# Patient Record
Sex: Female | Born: 1970 | Race: White | Hispanic: No | Marital: Married | Smoking: Former smoker
Health system: Southern US, Community
[De-identification: ages and names within clinical notes are randomized; demographics above are authoritative.]

## PROBLEM LIST (undated history)

## (undated) DIAGNOSIS — I10 Essential (primary) hypertension: Secondary | ICD-10-CM

## (undated) DIAGNOSIS — J45909 Unspecified asthma, uncomplicated: Secondary | ICD-10-CM

## (undated) DIAGNOSIS — K219 Gastro-esophageal reflux disease without esophagitis: Secondary | ICD-10-CM

## (undated) HISTORY — PX: HIP SURGERY: SHX245

## (undated) HISTORY — PX: TUBAL LIGATION: SHX77

## (undated) HISTORY — DX: Essential (primary) hypertension: I10

## (undated) HISTORY — PX: TONSILECTOMY, ADENOIDECTOMY, BILATERAL MYRINGOTOMY AND TUBES: SHX2538

## (undated) HISTORY — PX: GASTRIC BYPASS: SHX52

## (undated) HISTORY — PX: PARTIAL HYSTERECTOMY: SHX80

## (undated) HISTORY — PX: CHOLECYSTECTOMY: SHX55

---

## 2004-09-09 ENCOUNTER — Ambulatory Visit: Payer: Self-pay | Admitting: Podiatry

## 2004-10-04 ENCOUNTER — Emergency Department: Payer: Self-pay | Admitting: Emergency Medicine

## 2005-03-01 ENCOUNTER — Ambulatory Visit: Payer: Self-pay | Admitting: Specialist

## 2005-05-08 ENCOUNTER — Inpatient Hospital Stay: Payer: Self-pay

## 2007-08-12 ENCOUNTER — Emergency Department: Payer: Self-pay | Admitting: Emergency Medicine

## 2009-03-13 ENCOUNTER — Emergency Department: Payer: Self-pay | Admitting: Emergency Medicine

## 2009-07-05 ENCOUNTER — Emergency Department: Payer: Self-pay | Admitting: Emergency Medicine

## 2009-10-01 ENCOUNTER — Emergency Department: Payer: Self-pay | Admitting: Emergency Medicine

## 2011-01-17 ENCOUNTER — Emergency Department: Payer: Self-pay | Admitting: Emergency Medicine

## 2011-08-04 ENCOUNTER — Inpatient Hospital Stay: Payer: Self-pay | Admitting: Internal Medicine

## 2011-08-12 ENCOUNTER — Ambulatory Visit: Payer: Self-pay | Admitting: Gastroenterology

## 2012-05-21 ENCOUNTER — Ambulatory Visit: Payer: Self-pay

## 2013-06-25 ENCOUNTER — Ambulatory Visit: Payer: Self-pay | Admitting: Family Medicine

## 2015-03-19 ENCOUNTER — Other Ambulatory Visit: Payer: Self-pay | Admitting: Family Medicine

## 2015-03-19 DIAGNOSIS — Z1239 Encounter for other screening for malignant neoplasm of breast: Secondary | ICD-10-CM

## 2015-05-17 ENCOUNTER — Ambulatory Visit
Admission: RE | Admit: 2015-05-17 | Discharge: 2015-05-17 | Disposition: A | Discharge status: Home / Self Care | Payer: BC Managed Care – PPO | Source: Ambulatory Visit | Attending: Family Medicine | Admitting: Family Medicine

## 2015-05-17 DIAGNOSIS — Z1231 Encounter for screening mammogram for malignant neoplasm of breast: Secondary | ICD-10-CM | POA: Diagnosis present

## 2015-05-17 DIAGNOSIS — R928 Other abnormal and inconclusive findings on diagnostic imaging of breast: Secondary | ICD-10-CM | POA: Diagnosis not present

## 2015-05-17 DIAGNOSIS — Z1239 Encounter for other screening for malignant neoplasm of breast: Secondary | ICD-10-CM

## 2015-05-18 ENCOUNTER — Other Ambulatory Visit: Payer: Self-pay | Admitting: Family Medicine

## 2015-05-18 DIAGNOSIS — R928 Other abnormal and inconclusive findings on diagnostic imaging of breast: Secondary | ICD-10-CM

## 2015-05-18 DIAGNOSIS — N6489 Other specified disorders of breast: Secondary | ICD-10-CM

## 2015-05-19 ENCOUNTER — Ambulatory Visit: Payer: BC Managed Care – PPO

## 2015-05-19 ENCOUNTER — Ambulatory Visit
Admission: RE | Admit: 2015-05-19 | Discharge: 2015-05-19 | Disposition: A | Discharge status: Home / Self Care | Payer: BC Managed Care – PPO | Source: Ambulatory Visit | Attending: Family Medicine | Admitting: Family Medicine

## 2015-05-19 DIAGNOSIS — R928 Other abnormal and inconclusive findings on diagnostic imaging of breast: Secondary | ICD-10-CM | POA: Diagnosis present

## 2015-05-19 DIAGNOSIS — N6489 Other specified disorders of breast: Secondary | ICD-10-CM | POA: Insufficient documentation

## 2015-05-20 ENCOUNTER — Ambulatory Visit: Payer: BC Managed Care – PPO

## 2015-05-20 ENCOUNTER — Other Ambulatory Visit: Payer: BC Managed Care – PPO

## 2016-07-20 IMAGING — MG MM DIAG BREAST TOMO UNI RIGHT
6 series · 6 of 14 positions shown · non-contrast
Comparison: Previous exam(s).

CLINICAL DATA: Further evaluation of possible upper-outer quadrant
right breast asymmetry

EXAM:
DIGITAL DIAGNOSTIC RIGHT MAMMOGRAM WITH 3D TOMOSYNTHESIS AND CAD

[R CC]
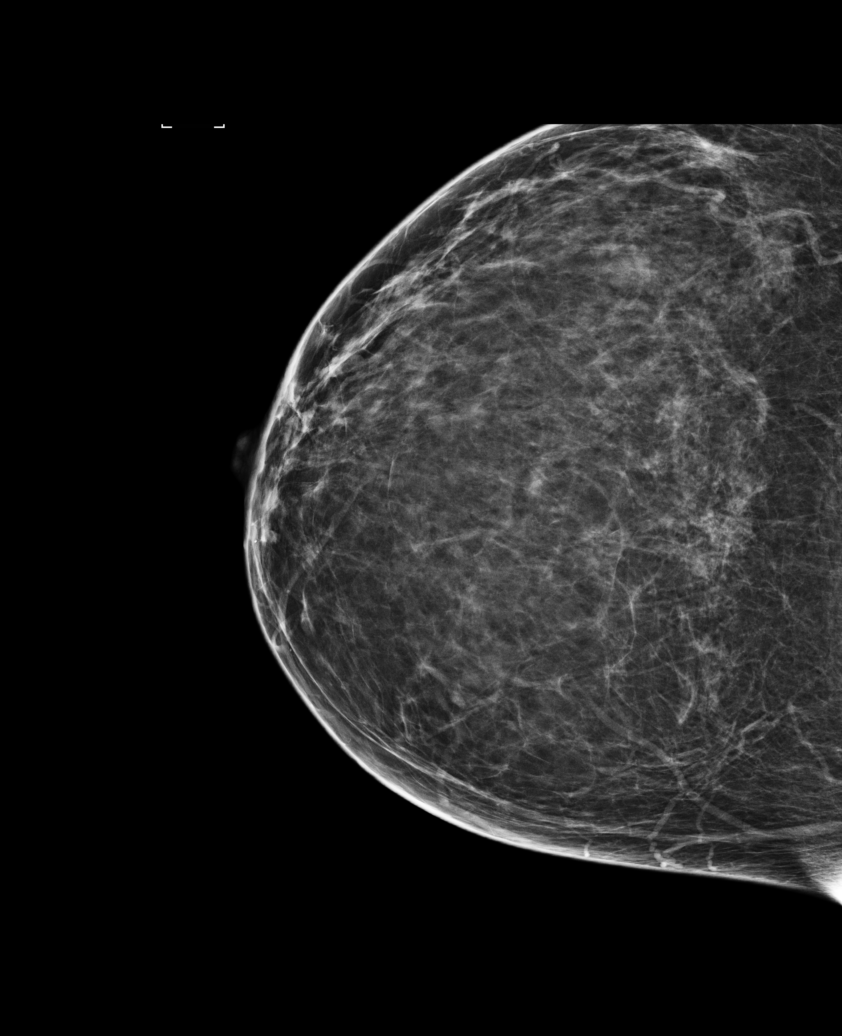

[R MLO synth-2D]
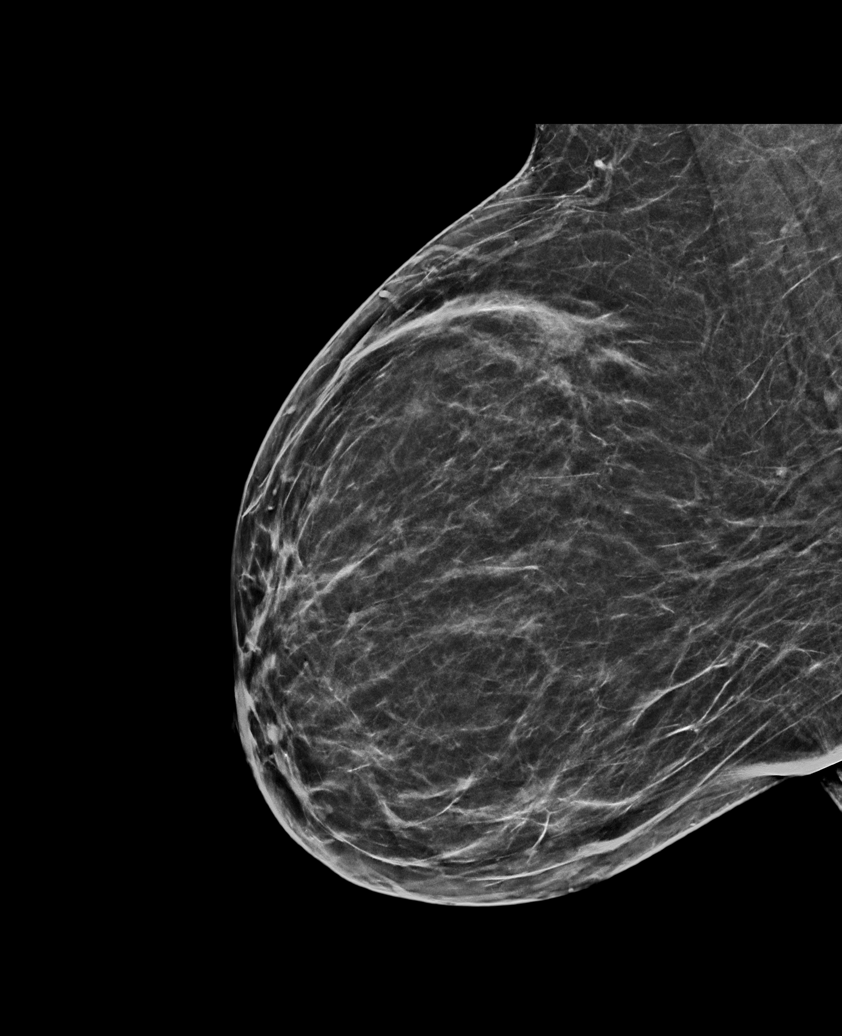

[R MLO]
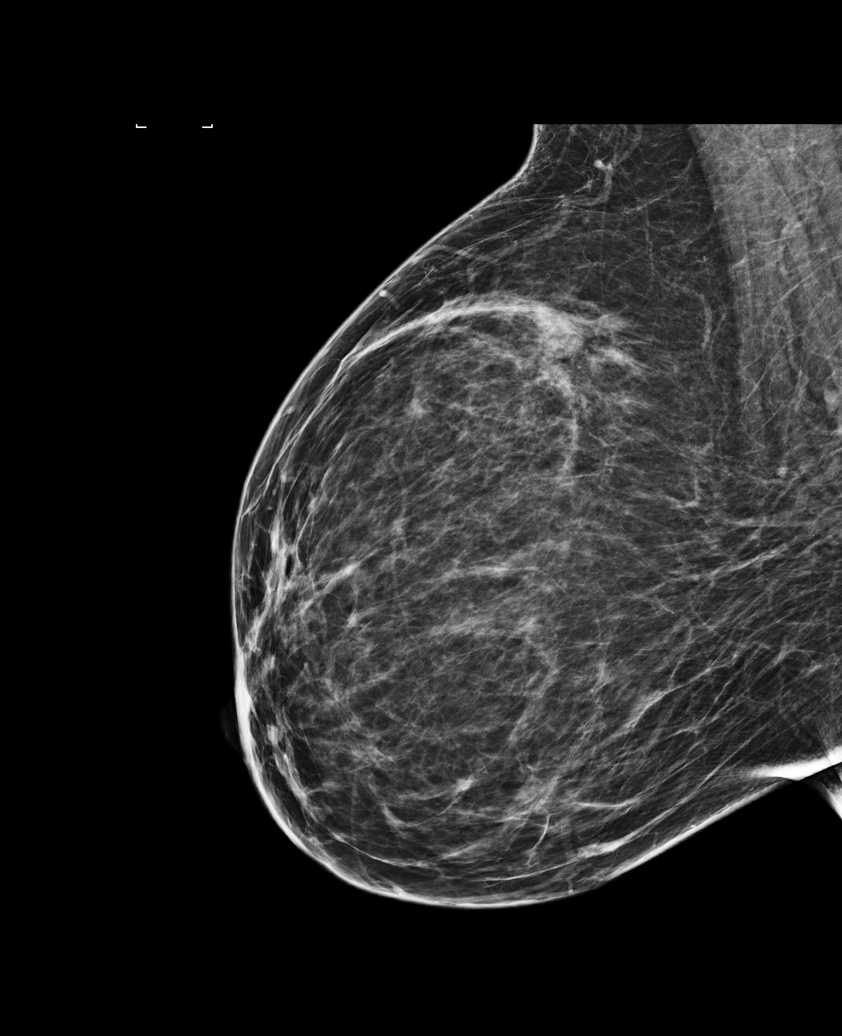

[R CC synth-2D]
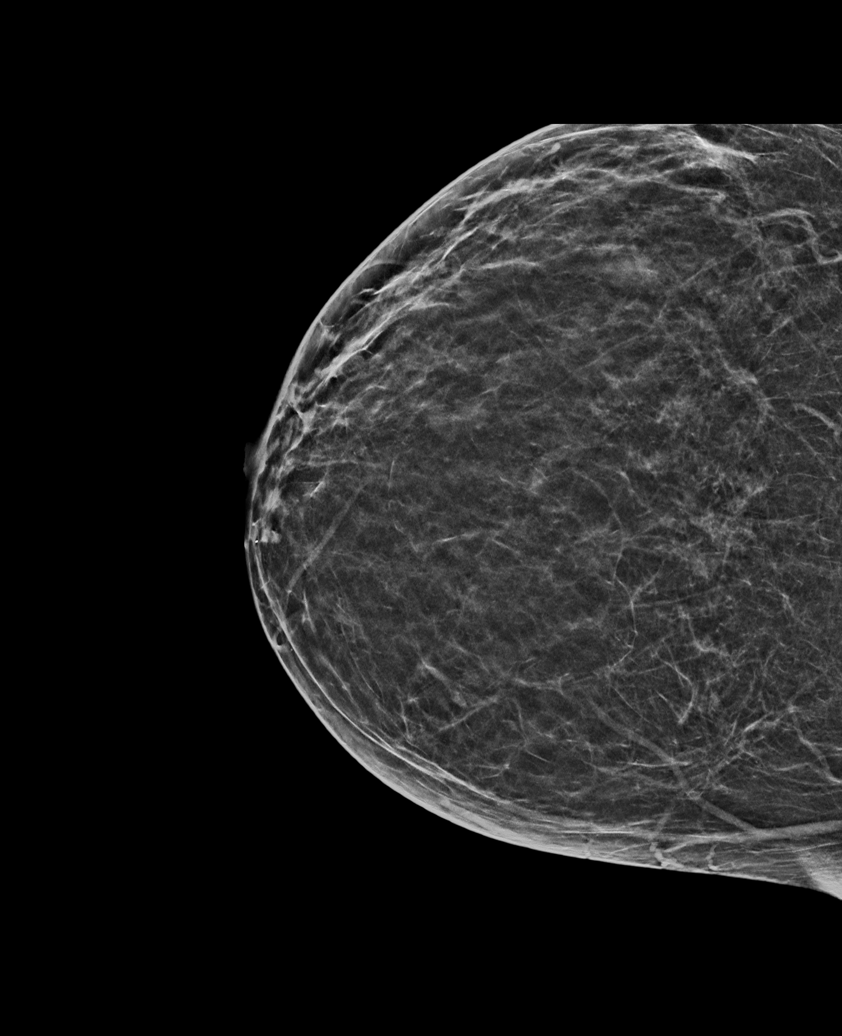

[R MLO tomo · tomo slice 31/60.0]
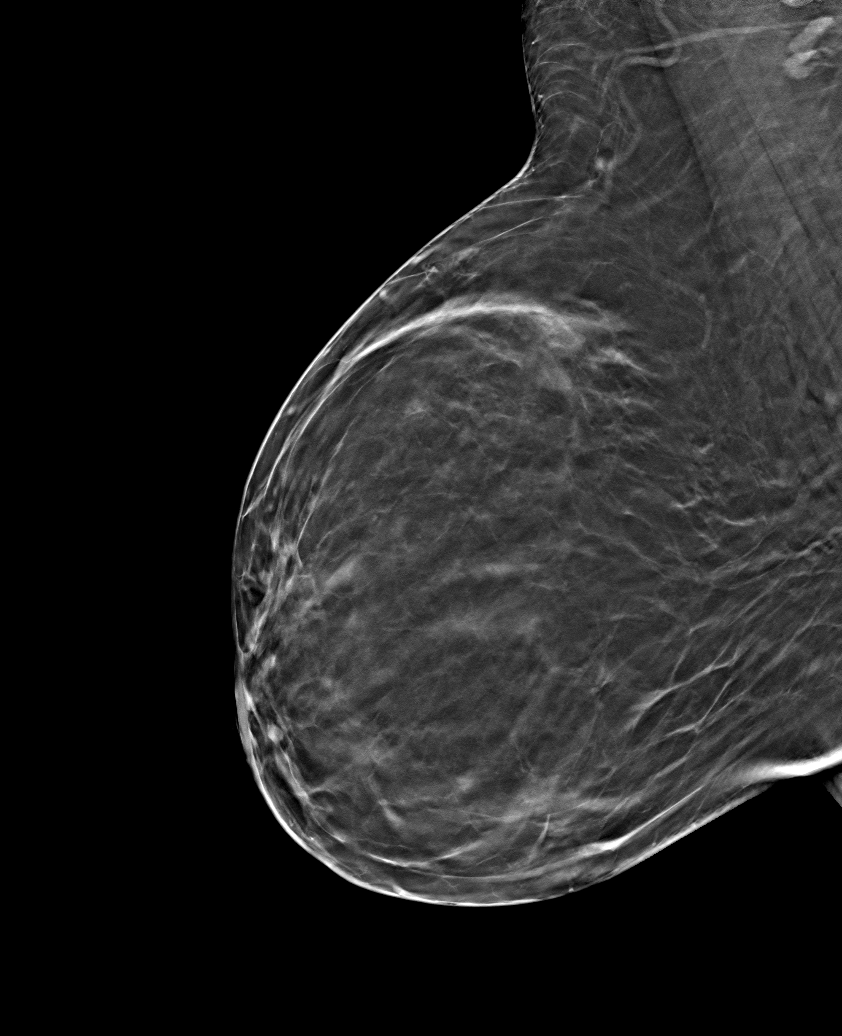

[R CC tomo · tomo slice 25/50.0]
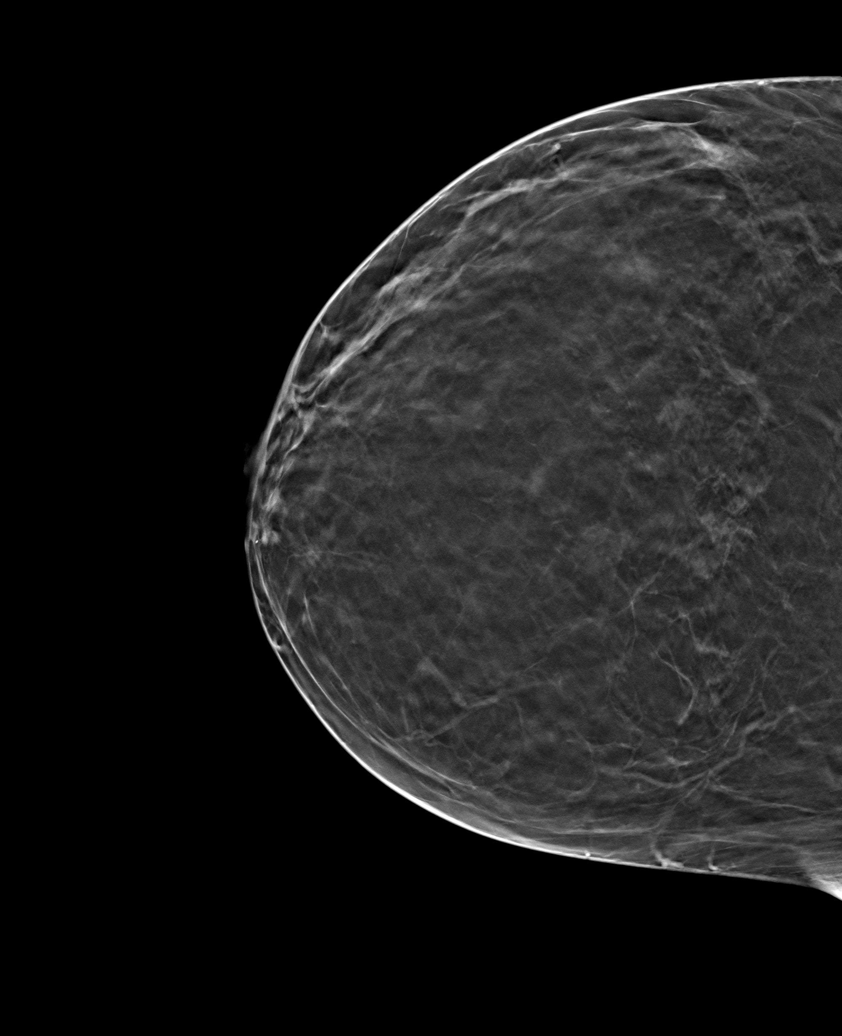

[6 of 14 positions shown; findings below may reference images not displayed]

ACR Breast Density Category b: There are scattered areas of
fibroglandular density.
FINDINGS: There are no persistent abnormalities on diagnostic tomographic
images.

Mammographic images were processed with CAD.
IMPRESSION: Normal overlapping tissue

RECOMMENDATION:
Return to annual screening mammography

I have discussed the findings and recommendations with the patient.
Results were also provided in writing at the conclusion of the
visit. If applicable, a reminder letter will be sent to the patient
regarding the next appointment.

BI-RADS CATEGORY  1: Negative.

## 2017-04-27 ENCOUNTER — Ambulatory Visit: Payer: BC Managed Care – PPO | Attending: Neurology

## 2017-04-27 DIAGNOSIS — F5101 Primary insomnia: Secondary | ICD-10-CM | POA: Insufficient documentation

## 2017-04-27 DIAGNOSIS — R0683 Snoring: Secondary | ICD-10-CM | POA: Insufficient documentation

## 2018-08-06 ENCOUNTER — Other Ambulatory Visit: Payer: Self-pay | Admitting: Family

## 2018-08-06 DIAGNOSIS — R1032 Left lower quadrant pain: Secondary | ICD-10-CM

## 2018-08-31 ENCOUNTER — Other Ambulatory Visit
Admission: RE | Admit: 2018-08-31 | Discharge: 2018-08-31 | Disposition: A | Discharge status: Home / Self Care | Payer: BC Managed Care – PPO | Source: Ambulatory Visit | Attending: Student | Admitting: Student

## 2018-08-31 DIAGNOSIS — K529 Noninfective gastroenteritis and colitis, unspecified: Secondary | ICD-10-CM | POA: Insufficient documentation

## 2018-08-31 DIAGNOSIS — R1084 Generalized abdominal pain: Secondary | ICD-10-CM | POA: Insufficient documentation

## 2018-08-31 LAB — C DIFFICILE QUICK SCREEN W PCR REFLEX
C Diff antigen: NEGATIVE
C Diff interpretation: NOT DETECTED
C Diff toxin: NEGATIVE

## 2018-08-31 LAB — GASTROINTESTINAL PANEL BY PCR, STOOL (REPLACES STOOL CULTURE)

## 2018-09-05 LAB — CALPROTECTIN, FECAL: Calprotectin, Fecal: 98 ug/g (ref 0–120)

## 2018-09-09 LAB — PANCREATIC ELASTASE, FECAL: Pancreatic Elastase-1, Stool: >500 ug Elast./g (ref >200)

## 2019-07-31 ENCOUNTER — Other Ambulatory Visit: Payer: Self-pay | Admitting: Family Medicine

## 2019-07-31 DIAGNOSIS — Z1231 Encounter for screening mammogram for malignant neoplasm of breast: Secondary | ICD-10-CM

## 2019-09-02 ENCOUNTER — Other Ambulatory Visit: Payer: Self-pay | Admitting: Student

## 2019-09-02 DIAGNOSIS — R1010 Upper abdominal pain, unspecified: Secondary | ICD-10-CM

## 2019-09-02 DIAGNOSIS — R198 Other specified symptoms and signs involving the digestive system and abdomen: Secondary | ICD-10-CM

## 2019-09-22 ENCOUNTER — Encounter
Admission: RE | Admit: 2019-09-22 | Discharge: 2019-09-22 | Disposition: A | Discharge status: Home / Self Care | Payer: BC Managed Care – PPO | Source: Ambulatory Visit | Attending: Student | Admitting: Student

## 2019-09-22 ENCOUNTER — Other Ambulatory Visit: Payer: Self-pay

## 2019-09-22 DIAGNOSIS — R198 Other specified symptoms and signs involving the digestive system and abdomen: Secondary | ICD-10-CM | POA: Diagnosis present

## 2019-09-22 DIAGNOSIS — R1010 Upper abdominal pain, unspecified: Secondary | ICD-10-CM | POA: Insufficient documentation

## 2019-09-22 MED ORDER — TECHNETIUM TC 99M SULFUR COLLOID
2.0000 | Freq: Once | INTRAVENOUS | Status: AC | PRN
  Administered 2019-09-22: 2.39 via INTRAVENOUS

## 2019-11-06 ENCOUNTER — Ambulatory Visit
Admission: RE | Admit: 2019-11-06 | Discharge: 2019-11-06 | Disposition: A | Discharge status: Home / Self Care | Payer: BC Managed Care – PPO | Source: Ambulatory Visit | Attending: Family Medicine | Admitting: Family Medicine

## 2019-11-06 DIAGNOSIS — Z1231 Encounter for screening mammogram for malignant neoplasm of breast: Secondary | ICD-10-CM | POA: Diagnosis present

## 2020-06-23 ENCOUNTER — Other Ambulatory Visit: Payer: Self-pay | Admitting: Physician Assistant

## 2020-06-23 DIAGNOSIS — M545 Low back pain, unspecified: Secondary | ICD-10-CM

## 2020-06-29 ENCOUNTER — Other Ambulatory Visit: Payer: Self-pay | Admitting: Physician Assistant

## 2020-06-29 DIAGNOSIS — R319 Hematuria, unspecified: Secondary | ICD-10-CM

## 2020-06-29 DIAGNOSIS — M545 Low back pain, unspecified: Secondary | ICD-10-CM

## 2020-11-23 ENCOUNTER — Other Ambulatory Visit: Payer: Self-pay | Admitting: Family Medicine

## 2020-11-23 DIAGNOSIS — Z1231 Encounter for screening mammogram for malignant neoplasm of breast: Secondary | ICD-10-CM

## 2020-12-20 ENCOUNTER — Ambulatory Visit
Admission: RE | Admit: 2020-12-20 | Discharge: 2020-12-20 | Disposition: A | Discharge status: Home / Self Care | Payer: BC Managed Care – PPO | Source: Ambulatory Visit | Attending: Family Medicine | Admitting: Family Medicine

## 2020-12-20 ENCOUNTER — Other Ambulatory Visit: Payer: Self-pay

## 2020-12-20 DIAGNOSIS — Z1231 Encounter for screening mammogram for malignant neoplasm of breast: Secondary | ICD-10-CM | POA: Diagnosis not present

## 2021-05-26 ENCOUNTER — Institutional Professional Consult (permissible substitution): Payer: BC Managed Care – PPO | Admitting: Internal Medicine

## 2021-06-08 ENCOUNTER — Other Ambulatory Visit: Payer: Self-pay

## 2021-06-08 ENCOUNTER — Ambulatory Visit: Payer: BC Managed Care – PPO | Admitting: Pulmonary Disease

## 2021-06-08 ENCOUNTER — Other Ambulatory Visit
Admission: RE | Admit: 2021-06-08 | Discharge: 2021-06-08 | Disposition: A | Discharge status: Home / Self Care | Payer: BC Managed Care – PPO | Source: Ambulatory Visit | Attending: Pulmonary Disease | Admitting: Pulmonary Disease

## 2021-06-08 ENCOUNTER — Encounter: Payer: Self-pay | Admitting: Pulmonary Disease

## 2021-06-08 VITALS — BP 140/100 | HR 80 | Temp 98.2°F | Ht 65.0 in | Wt 222.8 lb

## 2021-06-08 DIAGNOSIS — Z01812 Encounter for preprocedural laboratory examination: Secondary | ICD-10-CM | POA: Insufficient documentation

## 2021-06-08 DIAGNOSIS — R053 Chronic cough: Secondary | ICD-10-CM

## 2021-06-08 DIAGNOSIS — K219 Gastro-esophageal reflux disease without esophagitis: Secondary | ICD-10-CM

## 2021-06-08 DIAGNOSIS — Z20822 Contact with and (suspected) exposure to covid-19: Secondary | ICD-10-CM | POA: Diagnosis not present

## 2021-06-08 LAB — SARS CORONAVIRUS 2 (TAT 6-24 HRS): SARS Coronavirus 2: NEGATIVE

## 2021-06-08 NOTE — Patient Instructions (Signed)
I am going to recommend that your primary care physician change your blood pressure medication.  It seems like the ENT physician did see evidence of what we call laryngopharyngeal reflux on your exam.  This comes from significant reflux.  Please continue taking your antireflux medications.  If your symptoms continue I would recommend that you get referred to a gastroenterologist to see if anything else needs to be done.  I suspect that this is adding to your cough issues.  We are going to get breathing test to evaluate your your lung function.  I would like to see you back in 6 to 8 weeks time at that time we can then assess if you are having issues with cough what else needs to be done.  You should have your breathing test before then and that would give [place] an idea.

## 2021-06-08 NOTE — Progress Notes (Signed)
    Assessment & Plan:  1. Chronic cough (Primary) - Pulmonary Function Test [facility] Only; Future  2. Laryngopharyngeal reflux (LPR) - Pulmonary Function Test [facility] Only; Future   Patient Instructions  I am going to recommend that your primary care physician change your blood pressure medication.  It seems like the ENT physician did see evidence of what we call laryngopharyngeal reflux on your exam.  This comes from significant reflux.  Please continue taking your antireflux medications.  If your symptoms continue I would recommend that you get referred to a gastroenterologist to see if anything else needs to be done.  I suspect that this is adding to your cough issues.  We are going to get breathing test to evaluate your your lung function.  I would like to see you back in 6 to 8 weeks time at that time we can then assess if you are having issues with cough what else needs to be done.  You should have your breathing test before then and that would give us  an idea.  Please note: late entry documentation due to logistical difficulties during COVID-19 pandemic. This note is filed for information purposes only, and is not intended to be used for billing, nor does it represent the full scope/nature of the visit in question. Please see any associated scanned media linked to date of encounter for additional pertinent information.  Subjective:    HPI: Dawn Douglas is a 50 y.o. female presenting to the pulmonology clinic on 06/08/2021 with report of: Consult (Patient has a chronic cough for about 5-6 years but has got worse over the years, went for allergy testing and it was fine. Cough is mostly dry but sometimes productive with clear sputum. Exertion makes it worse. )     Outpatient Encounter Medications as of 06/08/2021  Medication Sig   cyanocobalamin (,VITAMIN B-12,) 1000 MCG/ML injection Inject 1,000 mcg into the muscle every 14 (fourteen) days.   fluticasone  (FLONASE) 50 MCG/ACT  nasal spray Place 2 sprays into both nostrils daily.   furosemide (LASIX) 40 MG tablet Take 40 mg by mouth daily.   rosuvastatin (CRESTOR) 20 MG tablet Take 40 mg by mouth at bedtime.   spironolactone (ALDACTONE) 50 MG tablet Take 50 mg by mouth daily.   venlafaxine XR (EFFEXOR-XR) 150 MG 24 hr capsule Take 150 mg by mouth daily.   zolpidem (AMBIEN CR) 6.25 MG CR tablet Take 6.25 mg by mouth at bedtime.   [DISCONTINUED] amLODipine -benazepril (LOTREL) 5-40 MG capsule Take 1 capsule by mouth daily. (Patient not taking: Reported on 10/26/2022)   [DISCONTINUED] pantoprazole (PROTONIX) 40 MG tablet Take 40 mg by mouth daily.   No facility-administered encounter medications on file as of 06/08/2021.      Objective:   Vitals:   06/08/21 0851  BP: (!) 140/100 Comment: has not been taking BP meds  Pulse: 80  Temp: 98.2 F (36.8 C)  Height: 5' 5 (1.651 m)  Weight: 222 lb 12.8 oz (101.1 kg)  SpO2: 97%  TempSrc: Oral  BMI (Calculated): 37.08     Physical exam documentation is limited by delayed entry of information.

## 2021-06-09 ENCOUNTER — Ambulatory Visit: Payer: BC Managed Care – PPO | Attending: Pulmonary Disease

## 2021-06-09 ENCOUNTER — Telehealth: Payer: Self-pay

## 2021-06-09 DIAGNOSIS — R053 Chronic cough: Secondary | ICD-10-CM | POA: Diagnosis not present

## 2021-06-09 DIAGNOSIS — K219 Gastro-esophageal reflux disease without esophagitis: Secondary | ICD-10-CM | POA: Insufficient documentation

## 2021-06-09 MED ORDER — ALBUTEROL SULFATE (2.5 MG/3ML) 0.083% IN NEBU
2.5000 mg | INHALATION_SOLUTION | Freq: Once | RESPIRATORY_TRACT | Status: AC
  Administered 2021-06-09: 2.5 mg via RESPIRATORY_TRACT
  Filled 2021-06-09: qty 3

## 2021-06-09 NOTE — Telephone Encounter (Signed)
Called and spoke with patient, pt aware, nothing further needed.

## 2021-07-13 ENCOUNTER — Telehealth: Payer: Self-pay | Admitting: Primary Care

## 2021-07-13 MED ORDER — FLUTICASONE FUROATE-VILANTEROL 100-25 MCG/INH IN AEPB: 1.0000 | INHALATION_SPRAY | Freq: Every day | RESPIRATORY_TRACT | 11 refills | Status: AC

## 2021-07-13 NOTE — Telephone Encounter (Signed)
Left detailed message for patient requesting a call back to discuss results.

## 2021-07-13 NOTE — Telephone Encounter (Signed)
We do not currently have samples of Breo. Rx has been sent to preferred pharmacy. Patient is aware and voiced her understanding.  Nothing further needed.

## 2021-07-13 NOTE — Telephone Encounter (Signed)
Spoke with patient to go over message from Dr. Patsey Berthold and recs. Patient expressed understanding. Told her I would call [place] office to see if they had samples for her to pick up and if they didn't we would call her back to let her know.

## 2021-07-13 NOTE — Telephone Encounter (Signed)
Tyler Pita, MD  Claudette Head A, CMA Her breathing test show that she may have cough variant asthma.  I still would want her primary care physician to change her blood pressure medication and she is on an ACE inhibitor.  We will give her a trial of Breo Ellipta 100/25, 1 inhalation daily or what ever similar medication her insurance covers.  She is to make sure she rinses her mouth well after use of Breo.  The breathing test also shows that she would benefit from weight loss as it would help her take deeper breaths.   Lm for patient.

## 2021-07-13 NOTE — Telephone Encounter (Signed)
Pt returning missed call. 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

## 2021-07-21 ENCOUNTER — Telehealth: Payer: Self-pay | Admitting: Pulmonary Disease

## 2021-07-21 NOTE — Telephone Encounter (Signed)
Dr. Patsey Berthold, please see below message and advise. thanks

## 2021-07-22 NOTE — Telephone Encounter (Signed)
Can postpone 2-3 mos.

## 2021-07-22 NOTE — Telephone Encounter (Signed)
Lm for patient.  

## 2021-07-25 ENCOUNTER — Ambulatory Visit: Payer: BC Managed Care – PPO | Admitting: Primary Care

## 2021-07-25 NOTE — Telephone Encounter (Signed)
Spoke to patient and relayed below message.  Patient stated that she will call back later this week to schedule, as she does not currently have her calendar.

## 2021-11-14 ENCOUNTER — Telehealth: Payer: Self-pay | Admitting: Pulmonary Disease

## 2021-11-14 ENCOUNTER — Ambulatory Visit: Payer: BC Managed Care – PPO | Admitting: Pulmonary Disease

## 2021-11-14 NOTE — Telephone Encounter (Signed)
Dr Patsey Berthold please advise:  Pt states new inhaler is working very well. no coughing, is asking if appointment could be a 69mo rov instead of 3.

## 2021-11-14 NOTE — Telephone Encounter (Signed)
Created in error

## 2021-11-14 NOTE — Telephone Encounter (Signed)
Called and LVM for patient in regards to rescheduling OFV. Per Dr Patsey Berthold 6 Month f/u is okay.

## 2021-11-14 NOTE — Telephone Encounter (Signed)
Okay for 6 months follow-up.

## 2021-11-22 NOTE — Telephone Encounter (Signed)
Called and spoke to patient in regards to 6 month rov. Nothing further needed. Pt will be seen 12/13/21

## 2021-12-13 ENCOUNTER — Ambulatory Visit: Payer: BC Managed Care – PPO | Admitting: Pulmonary Disease

## 2022-02-07 ENCOUNTER — Other Ambulatory Visit: Payer: Self-pay | Admitting: Family Medicine

## 2022-02-07 DIAGNOSIS — Z1231 Encounter for screening mammogram for malignant neoplasm of breast: Secondary | ICD-10-CM

## 2022-03-02 ENCOUNTER — Ambulatory Visit
Admission: RE | Admit: 2022-03-02 | Discharge: 2022-03-02 | Disposition: A | Discharge status: Home / Self Care | Payer: BC Managed Care – PPO | Source: Ambulatory Visit | Attending: Family Medicine | Admitting: Family Medicine

## 2022-03-02 DIAGNOSIS — Z1231 Encounter for screening mammogram for malignant neoplasm of breast: Secondary | ICD-10-CM | POA: Diagnosis not present

## 2022-03-30 ENCOUNTER — Encounter: Payer: Self-pay | Admitting: Pulmonary Disease

## 2022-03-30 ENCOUNTER — Ambulatory Visit: Payer: BC Managed Care – PPO | Admitting: Pulmonary Disease

## 2022-03-30 VITALS — BP 126/76 | HR 82 | Temp 97.8°F | Ht 65.0 in | Wt 215.8 lb

## 2022-03-30 DIAGNOSIS — K219 Gastro-esophageal reflux disease without esophagitis: Secondary | ICD-10-CM | POA: Diagnosis not present

## 2022-03-30 DIAGNOSIS — R053 Chronic cough: Secondary | ICD-10-CM | POA: Diagnosis not present

## 2022-03-30 DIAGNOSIS — J45991 Cough variant asthma: Secondary | ICD-10-CM | POA: Diagnosis not present

## 2022-03-30 DIAGNOSIS — E669 Obesity, unspecified: Secondary | ICD-10-CM

## 2022-03-30 MED ORDER — ALBUTEROL SULFATE HFA 108 (90 BASE) MCG/ACT IN AERS: 2.0000 | INHALATION_SPRAY | Freq: Four times a day (QID) | RESPIRATORY_TRACT | 2 refills | Status: DC | PRN

## 2022-03-30 NOTE — Patient Instructions (Signed)
We sent in a prescription for albuterol as rescue medication to your pharmacy.  Let us see you back in follow-up in 6 months time.  Call sooner should any new issues arise or if your cough recurs.

## 2022-03-30 NOTE — Progress Notes (Incomplete)
   Subjective:    Patient ID: Dawn Douglas, female    DOB: 19-Sep-1971, 51 y.o.   MRN: [id number]  Patient Care Team: Leonides Sake, MD as PCP - General (Family Medicine)  Chief Complaint  Patient presents with  . Follow-up    Review PFT--occ dry cough.    HPI    Review of Systems A 10 point review of systems was performed and it is as noted above otherwise negative.  Patient Active Problem List   Diagnosis Date Noted  . Cough variant asthma 03/30/2022  . Chronic cough 06/08/2021        Objective:   Physical Exam BP 126/76 (BP Location: Left Arm, Cuff Size: Normal)   Pulse 82   Temp 97.8 F (36.6 C) (Temporal)   Ht '5\' 5"'$  (1.651 m)   Wt 215 lb 12.8 oz (97.9 kg)   SpO2 95%   BMI 35.91 kg/m  GENERAL: HEAD: Normocephalic, atraumatic.  EYES: Pupils equal, round, reactive to light.  No scleral icterus.  MOUTH:  NECK: Supple. No thyromegaly. Trachea midline. No JVD.  No adenopathy. PULMONARY: Good air entry bilaterally.  No adventitious sounds. CARDIOVASCULAR: S1 and S2. Regular rate and rhythm.  ABDOMEN: MUSCULOSKELETAL: No joint deformity, no clubbing, no edema.  NEUROLOGIC:  SKIN: Intact,warm,dry. PSYCH:       Assessment & Plan:

## 2022-04-25 ENCOUNTER — Encounter: Payer: Self-pay | Admitting: Pulmonary Disease

## 2022-10-02 ENCOUNTER — Ambulatory Visit: Payer: Self-pay | Admitting: Surgery

## 2022-10-04 ENCOUNTER — Other Ambulatory Visit: Payer: Self-pay

## 2022-10-04 ENCOUNTER — Ambulatory Visit (INDEPENDENT_AMBULATORY_CARE_PROVIDER_SITE_OTHER): Payer: BC Managed Care – PPO | Admitting: Surgery

## 2022-10-04 ENCOUNTER — Encounter: Payer: Self-pay | Admitting: Surgery

## 2022-10-04 VITALS — BP 121/85 | HR 88 | Temp 98.7°F | Ht 65.0 in | Wt 219.0 lb

## 2022-10-04 DIAGNOSIS — D170 Benign lipomatous neoplasm of skin and subcutaneous tissue of head, face and neck: Secondary | ICD-10-CM

## 2022-10-04 NOTE — Patient Instructions (Addendum)
Please call and let us know when you decide to move forward with having surgery.    Our surgery scheduler will call you within 24-48 hours to schedule your surgery. Please have the [facility] surgery sheet available when speaking with her.     Lipoma  A lipoma is a noncancerous (benign) tumor that is made up of fat cells. This is a very common type of soft-tissue growth. Lipomas are usually found under the skin (subcutaneous). They may occur in any tissue of the body that contains fat. Common areas for lipomas to appear include the back, arms, shoulders, buttocks, and thighs. Lipomas grow slowly, and they are usually painless. Most lipomas do not cause problems and do not require treatment. What are the causes? The cause of this condition is not known. What increases the risk? You are more likely to develop this condition if: You are 55-64 years old. You have a family history of lipomas. What are the signs or symptoms? A lipoma usually appears as a small, round bump under the skin. In most cases, the lump will: Feel soft or rubbery. Not cause pain or other symptoms. However, if a lipoma is located in an area where it pushes on nerves, it can become painful or cause other symptoms. How is this diagnosed? A lipoma can usually be diagnosed with a physical exam. You may also have tests to confirm the diagnosis and to rule out other conditions. Tests may include: Imaging tests, such as a CT scan or an MRI. Removal of a tissue sample to be looked at under a microscope (biopsy). How is this treated? Treatment for this condition depends on the size of the lipoma and whether it is causing any symptoms. For small lipomas that are not causing problems, no treatment is needed. If a lipoma is bigger or it causes problems, surgery may be done to remove the lipoma. Lipomas can also be removed to improve appearance. Most often, the procedure is done after applying a medicine that numbs the area (local  anesthetic). Liposuction may be done to reduce the size of the lipoma before it is removed through surgery, or it may be done to remove the lipoma. Lipomas are removed with this method to limit incision size and scarring. A liposuction tube is inserted through a small incision into the lipoma, and the contents of the lipoma are removed through the tube with suction. Follow these instructions at home: Watch your lipoma for any changes. Keep all follow-up visits. This is important. Where to find more information OrthoInfo: orthoinfo.aaos.org Contact a health care provider if: Your lipoma becomes larger or hard. Your lipoma becomes painful, red, or increasingly swollen. These could be signs of infection or a more serious condition. Get help right away if: You develop tingling or numbness in an area near the lipoma. This could indicate that the lipoma is causing nerve damage. Summary A lipoma is a noncancerous tumor that is made up of fat cells. Most lipomas do not cause problems and do not require treatment. If a lipoma is bigger or it causes problems, surgery may be done to remove the lipoma. Contact a health care provider if your lipoma becomes larger or hard, or if it becomes painful, red, or increasingly swollen. These could be signs of infection or a more serious condition. This information is not intended to replace advice given to you by your health care provider. Make sure you discuss any questions you have with your health care provider. Document Revised: 11/04/2021 Document  Reviewed: 11/04/2021 Elsevier Patient Education  [place].

## 2022-10-05 ENCOUNTER — Encounter: Payer: Self-pay | Admitting: Surgery

## 2022-10-05 NOTE — Progress Notes (Signed)
10/05/2022  Reason for Visit:  Posterior neck lipoma  Requesting Provider:  Daiva Eves, MD  History of Present Illness: Dawn Douglas is a 51 y.o. 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  The patient reports having posterior neck and left shoulder pain for the past year, thinking that it's a pulled muscle or pinched nerve.  She reports intermittent tingliness or numbness in her fingers.  She has used ice and heat without any improvement.  More recently her symptoms have worsened.  She presented to Urgent Care and was given steroid and an injection and muscle relaxer.  She saw her PCP on 09/18/22 and on exam there was concern for a possible lipoma in the posterior neck.  She was referred for further management.  Denies any headaches or symptoms on the right arm.  Past Medical History: Past Medical History:  Diagnosis Date   Hypertension      Past Surgical History: Past Surgical History:  Procedure Laterality Date   CHOLECYSTECTOMY     GASTRIC BYPASS     HIP SURGERY     PARTIAL HYSTERECTOMY     TONSILECTOMY, ADENOIDECTOMY, BILATERAL MYRINGOTOMY AND TUBES     TUBAL LIGATION      Home Medications: Prior to Admission medications   Medication Sig Start Date End Date Taking? Authorizing Provider  albuterol (VENTOLIN HFA) 108 (90 Base) MCG/ACT inhaler Inhale 2 puffs into the lungs every 6 (six) hours as needed for wheezing or shortness of breath. 03/30/22  Yes Tyler Pita, MD  amLODipine-benazepril (LOTREL) 5-40 MG capsule Take 1 capsule by mouth daily. 05/20/21  Yes [provider]  Biotin 10000 MCG TABS Take by mouth.   Yes [provider]  buPROPion (WELLBUTRIN) 100 MG tablet Take 100 mg by mouth 2 (two) times daily.   Yes [provider]  cyanocobalamin (,VITAMIN B-12,) 1000 MCG/ML injection Inject 1,000 mcg into the muscle every 14 (fourteen) days. 05/05/21  Yes [provider]  fluticasone (FLONASE) 50 MCG/ACT nasal  spray Place 2 sprays into both nostrils daily. 04/26/21  Yes [provider]  fluticasone furoate-vilanterol (BREO ELLIPTA) 100-25 MCG/ACT AEPB Inhale 1 puff into the lungs daily.   Yes [provider]  furosemide (LASIX) 40 MG tablet Take 40 mg by mouth daily. 05/01/21  Yes [provider]  gabapentin (NEURONTIN) 300 MG capsule Take 300 mg by mouth 3 (three) times daily.   Yes [provider]  rosuvastatin (CRESTOR) 20 MG tablet Take 20 mg by mouth at bedtime. 03/27/21  Yes [provider]  spironolactone (ALDACTONE) 50 MG tablet Take 50 mg by mouth daily. 05/01/21  Yes [provider]  venlafaxine XR (EFFEXOR-XR) 150 MG 24 hr capsule Take 150 mg by mouth daily. 05/21/21  Yes [provider]  zolpidem (AMBIEN CR) 6.25 MG CR tablet Take 6.25 mg by mouth at bedtime. 03/10/21  Yes [provider]    Allergies: Allergies  Allergen Reactions   Loratadine     Other reaction(s): Headache Other reaction(s): HEADACHE    Amlodipine Swelling    Other reaction(s): SWELLING/EDEMA Other reaction(s): EDEMA    Procaine Swelling    Other reaction(s): UNKNOWN     Social History:  reports that she quit smoking about 34 years ago. Her smoking use included cigarettes. She has a 0.50 pack-year smoking history. She has never used smokeless tobacco. She reports that she does not use drugs. No history on file for alcohol use.   Family History: Family History  Problem Relation Age of Onset   Hypertension Mother    Heart disease Mother    Diabetes Father    Hypertension Father    Breast cancer Neg Hx     Review of Systems: Review of Systems  Constitutional:  Negative for chills and fever.  HENT:  Negative for hearing loss.   Respiratory:  Negative for shortness of breath.   Cardiovascular:  Negative for chest pain.  Gastrointestinal:  Negative for nausea and vomiting.  Genitourinary:  Negative for dysuria.  Musculoskeletal:   Negative for myalgias.  Skin:  Negative for rash.  Neurological:  Positive for tingling and sensory change.  Psychiatric/Behavioral:  Negative for depression.     Physical Exam BP 121/85   Pulse 88   Temp 98.7 F (37.1 C) (Oral)   Ht '5\' 5"'$  (1.651 m)   Wt 219 lb (99.3 kg)   SpO2 97%   BMI 36.44 kg/m  CONSTITUTIONAL: No acute distress, well nourished. HEENT:  Normocephalic, atraumatic, extraocular motion intact. NECK: Trachea is midline, and there is no jugular venous distension.  RESPIRATORY:  Lungs are clear, and breath sounds are equal bilaterally. Normal respiratory effort without pathologic use of accessory muscles. CARDIOVASCULAR: Heart is regular without murmurs, gallops, or rubs. MUSCULOSKELETAL:  Normal muscle strength and tone in all four extremities.  No peripheral edema or cyanosis. SKIN: The patient has a 7.5 x 5 cm mass overlying the lower posterior neck, paramedial location off more to the left side.  It is soft, somewhat mobile though limited by the limited space in the posterior neck.  No overlying skin changes or drainage or tenderness. NEUROLOGIC:  Motor and sensation is grossly normal.  Cranial nerves are grossly intact. PSYCH:  Alert and oriented to person, place and time. Affect is normal.  Laboratory Analysis: No results found for this or any previous visit (from the past 24 hour(s)).  Imaging: No results found.  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  Possibly the symptoms she's having could be related to mass effect, but discussed with her that it could still be a pinched nerve if there is any foraminal stenosis.  I think given the symptoms though, it is very reasonable to excise the mass.  She's in agreement. --Discussed with her then the plan for Excision of posterior neck lipoma.  Reviewed the surgery at length with her including the incision,  risks of bleeding, infection, injury to surrounding structures, the possibility of a drain, that this would be outpatient surgery, post-operative activity restrictions, and she's willing to proceed. --Patient will call us to schedule date for surgery after looking at her work schedule and her husband's schedule.  Anticipate early January 2024.  All of her questions have been answered.  I spent 40 minutes dedicated to the care of this patient on the date of this encounter to include pre-visit review of records, face-to-face time with the patient discussing diagnosis and management, and any post-visit coordination of care.   Melvyn Neth, [facility]

## 2022-10-06 ENCOUNTER — Telehealth: Payer: Self-pay | Admitting: Surgery

## 2022-10-06 NOTE — Telephone Encounter (Signed)
Patient calls back, she is informed of all dates regarding her surgery.   

## 2022-10-06 NOTE — Telephone Encounter (Signed)
Left message for patient to call, please inform her of the following regarding scheduled surgery.   Pre-Admission date/time, and Surgery date at [facility].  Surgery Date: 11/07/22 Preadmission Testing Date: 10/26/22 (phone 8a-1p)  Also patient will need to call at [phone], between 1-3:00pm the day before surgery, to find out what time to arrive for surgery.

## 2022-10-26 ENCOUNTER — Encounter
Admission: RE | Admit: 2022-10-26 | Discharge: 2022-10-26 | Disposition: A | Discharge status: Home / Self Care | Payer: BC Managed Care – PPO | Source: Ambulatory Visit | Attending: Surgery | Admitting: Surgery

## 2022-10-26 ENCOUNTER — Other Ambulatory Visit: Payer: Self-pay

## 2022-10-26 DIAGNOSIS — Z01818 Encounter for other preprocedural examination: Secondary | ICD-10-CM

## 2022-10-26 DIAGNOSIS — I10 Essential (primary) hypertension: Secondary | ICD-10-CM

## 2022-10-26 HISTORY — DX: Gastro-esophageal reflux disease without esophagitis: K21.9

## 2022-10-26 HISTORY — DX: Unspecified asthma, uncomplicated: J45.909

## 2022-10-26 NOTE — Patient Instructions (Addendum)
Your procedure is scheduled on:11/07/2021  Report to the Registration Desk on the 1st floor of the [facility]. To find out your arrival time, please call [phone] between 1PM - 3PM on: 11/06/2021  If your arrival time is 6:00 am, do not arrive prior to that time as the [facility] entrance doors do not open until 6:00 am.  REMEMBER: Instructions that are not followed completely may result in serious medical risk, up to and including death; or upon the discretion of your surgeon and anesthesiologist your surgery may need to be rescheduled.  Do not eat food after midnight the night before surgery.  No gum chewing, lozengers or hard candies.  You may however, drink CLEAR liquids up to 2 hours before you are scheduled to arrive for your surgery. Do not drink anything within 2 hours of your scheduled arrival time.  Clear liquids include: - water  - apple juice without pulp - gatorade (not RED colors) - black coffee or tea (Do NOT add milk or creamers to the coffee or tea) Do NOT drink anything that is not on this list.   TAKE THESE MEDICATIONS THE MORNING OF SURGERY WITH A SIP OF WATER:  Wellbutrin   Take your nasal spray on day of surgery if needed.  One week prior to surgery: Stop Anti-inflammatories (NSAIDS) such as Advil, Aleve, Ibuprofen, Motrin, Naproxen, Naprosyn and Aspirin based products such as Excedrin, Goodys Powder, BC Powder.-they have blood thinning effect Stop ANY OVER THE COUNTER supplements until after surgery like biotin, etc....  You may however, continue to take Tylenol if needed for pain up until the day of surgery.  No Alcohol for 24 hours before or after surgery.  No Smoking including e-cigarettes for 24 hours prior to surgery.  No chewable tobacco products for at least 6 hours prior to surgery.  No nicotine patches on the day of surgery.  Do not use any "recreational" drugs for at least a week prior to your surgery.  Please be advised that the  combination of cocaine and anesthesia may have negative outcomes, up to and including death. If you test positive for cocaine, your surgery will be cancelled.  On the morning of surgery brush your teeth with toothpaste and water, you may rinse your mouth with mouthwash if you wish. Do not swallow any toothpaste or mouthwash.  Use CHG Soap  as directed on instruction sheet.  Do not wear jewelry, make-up, hairpins, clips or nail polish.  Do not wear lotions, powders, or perfumes.   Do not shave body from the neck down 48 hours prior to surgery just in case you cut yourself which could leave a site for infection.  Also, freshly shaved skin may become irritated if using the CHG soap.  Contact lenses, hearing aids and dentures may not be worn into surgery.  Do not bring valuables to the hospital. [facility] is not responsible for any missing/lost belongings or valuables.   Notify your doctor if there is any change in your medical condition (cold, fever, infection).  Wear comfortable clothing (specific to your surgery type) to the hospital.  After surgery, you can help prevent lung complications by doing breathing exercises.  Take deep breaths and cough every 1-2 hours. Your doctor may order a device called an Incentive Spirometer to help you take deep breaths. If you are being admitted to the hospital overnight, leave your suitcase in the car. After surgery it may be brought to your room.  If you are being  discharged the day of surgery, you will not be allowed to drive home. You will need a responsible adult (18 years or older) to drive you home and stay with you that night.   If you are taking public transportation, you will need to have a responsible adult (18 years or older) with you. Please confirm with your physician that it is acceptable to use public transportation.   Please call the [facility] Dept. at [phone] if you have any questions about these  instructions.  Surgery Visitation Policy:  Patients undergoing a surgery or procedure may have two family members or support persons with them as long as the person is not COVID-19 positive or experiencing its symptoms.   Inpatient Visitation:    Visiting hours are 7 a.m. to 8 p.m. Up to four visitors are allowed at one time in a patient room. The visitors may rotate out with other people during the day. One designated support person (adult) may remain overnight.  Due to an increase in RSV and influenza rates and associated hospitalizations, children ages 72 and under will not be able to visit patients in [facility]. Masks continue to be strongly recommended.

## 2022-11-01 ENCOUNTER — Encounter
Admission: RE | Admit: 2022-11-01 | Discharge: 2022-11-01 | Disposition: A | Discharge status: Home / Self Care | Payer: BC Managed Care – PPO | Source: Ambulatory Visit | Attending: Surgery | Admitting: Surgery

## 2022-11-01 DIAGNOSIS — R9431 Abnormal electrocardiogram [ECG] [EKG]: Secondary | ICD-10-CM | POA: Diagnosis not present

## 2022-11-01 DIAGNOSIS — Z01818 Encounter for other preprocedural examination: Secondary | ICD-10-CM | POA: Diagnosis present

## 2022-11-01 LAB — BASIC METABOLIC PANEL
Anion gap: 10 (ref 5–15)
BUN: 13 mg/dL (ref 6–20)
CO2: 25 mmol/L (ref 22–32)
Calcium: 9.2 mg/dL (ref 8.9–10.3)
Chloride: 102 mmol/L (ref 98–111)
Creatinine, Ser: 0.8 mg/dL (ref 0.44–1.00)
GFR, Estimated: >60 mL/min (ref >60)
Glucose, Bld: 91 mg/dL (ref 70–99)
Potassium: 3.8 mmol/L (ref 3.5–5.1)
Sodium: 137 mmol/L (ref 135–145)

## 2022-11-01 LAB — CBC
HCT: 41.2 % (ref 36.0–46.0)
Hemoglobin: 13.6 g/dL (ref 12.0–15.0)
MCH: 32.2 pg (ref 26.0–34.0)
MCHC: 33 g/dL (ref 30.0–36.0)
MCV: 97.4 fL (ref 80.0–100.0)
Platelets: 226 10*3/uL (ref 150–400)
RBC: 4.23 MIL/uL (ref 3.87–5.11)
RDW: 12.6 % (ref 11.5–15.5)
WBC: 6.4 10*3/uL (ref 4.0–10.5)
nRBC: 0 % (ref 0.0–0.2)

## 2022-11-07 ENCOUNTER — Ambulatory Visit: Payer: BC Managed Care – PPO | Admitting: Certified Registered"

## 2022-11-07 ENCOUNTER — Other Ambulatory Visit: Payer: Self-pay

## 2022-11-07 ENCOUNTER — Encounter
Admission: RE | Disposition: A | Discharge status: Home / Self Care | Payer: Self-pay | Source: Ambulatory Visit | Attending: Surgery

## 2022-11-07 ENCOUNTER — Encounter: Payer: Self-pay | Admitting: Surgery

## 2022-11-07 ENCOUNTER — Ambulatory Visit
Admission: RE | Admit: 2022-11-07 | Discharge: 2022-11-07 | Disposition: A | Discharge status: Home / Self Care | Payer: BC Managed Care – PPO | Source: Ambulatory Visit | Attending: Surgery | Admitting: Surgery

## 2022-11-07 DIAGNOSIS — Z7951 Long term (current) use of inhaled steroids: Secondary | ICD-10-CM | POA: Insufficient documentation

## 2022-11-07 DIAGNOSIS — Z87891 Personal history of nicotine dependence: Secondary | ICD-10-CM | POA: Insufficient documentation

## 2022-11-07 DIAGNOSIS — J45909 Unspecified asthma, uncomplicated: Secondary | ICD-10-CM | POA: Insufficient documentation

## 2022-11-07 DIAGNOSIS — D170 Benign lipomatous neoplasm of skin and subcutaneous tissue of head, face and neck: Secondary | ICD-10-CM

## 2022-11-07 DIAGNOSIS — Z9884 Bariatric surgery status: Secondary | ICD-10-CM | POA: Insufficient documentation

## 2022-11-07 DIAGNOSIS — K219 Gastro-esophageal reflux disease without esophagitis: Secondary | ICD-10-CM | POA: Diagnosis not present

## 2022-11-07 DIAGNOSIS — I10 Essential (primary) hypertension: Secondary | ICD-10-CM | POA: Diagnosis not present

## 2022-11-07 DIAGNOSIS — Z79899 Other long term (current) drug therapy: Secondary | ICD-10-CM | POA: Diagnosis not present

## 2022-11-07 HISTORY — PX: LIPOMA EXCISION: SHX5283

## 2022-11-07 SURGERY — EXCISION LIPOMA
Anesthesia: General | Site: Neck

## 2022-11-07 MED ORDER — SUGAMMADEX SODIUM 200 MG/2ML IV SOLN
INTRAVENOUS | Status: DC | PRN
  Administered 2022-11-07: 200 mg via INTRAVENOUS

## 2022-11-07 MED ORDER — AMLODIPINE BESYLATE 5 MG PO TABS: 5.0000 mg | ORAL_TABLET | Freq: Once | ORAL | Status: AC

## 2022-11-07 MED ORDER — ONDANSETRON HCL 4 MG/2ML IJ SOLN
INTRAMUSCULAR | Status: DC | PRN
  Administered 2022-11-07: 4 mg via INTRAVENOUS

## 2022-11-07 MED ORDER — BUPIVACAINE LIPOSOME 1.3 % IJ SUSP: 20.0000 mL | Freq: Once | INTRAMUSCULAR | Status: DC

## 2022-11-07 MED ORDER — CHLORHEXIDINE GLUCONATE CLOTH 2 % EX PADS: 6.0000 | MEDICATED_PAD | Freq: Once | CUTANEOUS | Status: DC

## 2022-11-07 MED ORDER — BACITRACIN ZINC 500 UNIT/GM EX OINT
TOPICAL_OINTMENT | CUTANEOUS | Status: AC
  Filled 2022-11-07: qty 28.35

## 2022-11-07 MED ORDER — GABAPENTIN 300 MG PO CAPS: 300.0000 mg | ORAL_CAPSULE | ORAL | Status: AC

## 2022-11-07 MED ORDER — GABAPENTIN 300 MG PO CAPS
ORAL_CAPSULE | ORAL | Status: AC
  Administered 2022-11-07: 300 mg via ORAL
  Filled 2022-11-07: qty 1

## 2022-11-07 MED ORDER — DEXAMETHASONE SODIUM PHOSPHATE 10 MG/ML IJ SOLN
INTRAMUSCULAR | Status: DC | PRN
  Administered 2022-11-07: 10 mg via INTRAVENOUS

## 2022-11-07 MED ORDER — MIDAZOLAM HCL 2 MG/2ML IJ SOLN
INTRAMUSCULAR | Status: AC
  Filled 2022-11-07: qty 2

## 2022-11-07 MED ORDER — FAMOTIDINE 20 MG PO TABS: 20.0000 mg | ORAL_TABLET | Freq: Once | ORAL | Status: AC

## 2022-11-07 MED ORDER — CHLORHEXIDINE GLUCONATE CLOTH 2 % EX PADS
6.0000 | MEDICATED_PAD | Freq: Once | CUTANEOUS | Status: AC
  Administered 2022-11-07: 6 via TOPICAL

## 2022-11-07 MED ORDER — PROPOFOL 10 MG/ML IV BOLUS
INTRAVENOUS | Status: AC
  Filled 2022-11-07: qty 60

## 2022-11-07 MED ORDER — ACETAMINOPHEN 500 MG PO TABS: 1000.0000 mg | ORAL_TABLET | Freq: Four times a day (QID) | ORAL | Status: AC | PRN

## 2022-11-07 MED ORDER — IBUPROFEN 600 MG PO TABS: 600.0000 mg | ORAL_TABLET | Freq: Three times a day (TID) | ORAL | 0 refills | Status: AC | PRN

## 2022-11-07 MED ORDER — BUPIVACAINE-EPINEPHRINE (PF) 0.5% -1:200000 IJ SOLN
INTRAMUSCULAR | Status: AC
  Filled 2022-11-07: qty 30

## 2022-11-07 MED ORDER — AMLODIPINE BESYLATE 5 MG PO TABS
ORAL_TABLET | ORAL | Status: AC
  Administered 2022-11-07: 5 mg via ORAL
  Filled 2022-11-07: qty 1

## 2022-11-07 MED ORDER — LACTATED RINGERS IV SOLN: INTRAVENOUS | Status: DC

## 2022-11-07 MED ORDER — BUPIVACAINE LIPOSOME 1.3 % IJ SUSP
INTRAMUSCULAR | Status: AC
  Filled 2022-11-07: qty 10

## 2022-11-07 MED ORDER — BUPIVACAINE HCL (PF) 0.5 % IJ SOLN
INTRAMUSCULAR | Status: AC
  Filled 2022-11-07: qty 30

## 2022-11-07 MED ORDER — FENTANYL CITRATE (PF) 100 MCG/2ML IJ SOLN
INTRAMUSCULAR | Status: DC | PRN
  Administered 2022-11-07 (×2): 50 ug via INTRAVENOUS

## 2022-11-07 MED ORDER — LIDOCAINE HCL (CARDIAC) PF 100 MG/5ML IV SOSY
PREFILLED_SYRINGE | INTRAVENOUS | Status: DC | PRN
  Administered 2022-11-07: 100 mg via INTRAVENOUS

## 2022-11-07 MED ORDER — ACETAMINOPHEN 500 MG PO TABS
ORAL_TABLET | ORAL | Status: AC
  Administered 2022-11-07: 1000 mg via ORAL
  Filled 2022-11-07: qty 2

## 2022-11-07 MED ORDER — FENTANYL CITRATE (PF) 100 MCG/2ML IJ SOLN: 25.0000 ug | INTRAMUSCULAR | Status: DC | PRN

## 2022-11-07 MED ORDER — CHLORHEXIDINE GLUCONATE 0.12 % MT SOLN
OROMUCOSAL | Status: AC
  Administered 2022-11-07: 15 mL via OROMUCOSAL
  Filled 2022-11-07: qty 15

## 2022-11-07 MED ORDER — OXYCODONE HCL 5 MG PO TABS: 5.0000 mg | ORAL_TABLET | Freq: Four times a day (QID) | ORAL | 0 refills | Status: AC | PRN

## 2022-11-07 MED ORDER — OXYCODONE HCL 5 MG PO TABS
ORAL_TABLET | ORAL | Status: AC
  Filled 2022-11-07: qty 1

## 2022-11-07 MED ORDER — HYDROMORPHONE HCL 1 MG/ML IJ SOLN
INTRAMUSCULAR | Status: AC
  Filled 2022-11-07: qty 1

## 2022-11-07 MED ORDER — CEFAZOLIN SODIUM-DEXTROSE 2-4 GM/100ML-% IV SOLN
INTRAVENOUS | Status: AC
  Filled 2022-11-07: qty 100

## 2022-11-07 MED ORDER — OXYCODONE HCL 5 MG/5ML PO SOLN: 5.0000 mg | Freq: Once | ORAL | Status: AC | PRN

## 2022-11-07 MED ORDER — DEXMEDETOMIDINE HCL IN NACL 200 MCG/50ML IV SOLN
INTRAVENOUS | Status: DC | PRN
  Administered 2022-11-07: 8 ug via INTRAVENOUS

## 2022-11-07 MED ORDER — OXYCODONE HCL 5 MG PO TABS
5.0000 mg | ORAL_TABLET | Freq: Once | ORAL | Status: AC | PRN
  Administered 2022-11-07: 5 mg via ORAL

## 2022-11-07 MED ORDER — BUPIVACAINE LIPOSOME 1.3 % IJ SUSP
INTRAMUSCULAR | Status: AC
  Filled 2022-11-07: qty 20

## 2022-11-07 MED ORDER — MIDAZOLAM HCL 2 MG/2ML IJ SOLN
INTRAMUSCULAR | Status: DC | PRN
  Administered 2022-11-07: 2 mg via INTRAVENOUS

## 2022-11-07 MED ORDER — FAMOTIDINE 20 MG PO TABS
ORAL_TABLET | ORAL | Status: AC
  Administered 2022-11-07: 20 mg via ORAL
  Filled 2022-11-07: qty 1

## 2022-11-07 MED ORDER — DEXAMETHASONE SODIUM PHOSPHATE 10 MG/ML IJ SOLN
INTRAMUSCULAR | Status: AC
  Filled 2022-11-07: qty 2

## 2022-11-07 MED ORDER — PROPOFOL 10 MG/ML IV BOLUS
INTRAVENOUS | Status: DC | PRN
  Administered 2022-11-07: 150 mg via INTRAVENOUS

## 2022-11-07 MED ORDER — 0.9 % SODIUM CHLORIDE (POUR BTL) OPTIME
TOPICAL | Status: DC | PRN
  Administered 2022-11-07: 250 mL

## 2022-11-07 MED ORDER — CEFAZOLIN SODIUM-DEXTROSE 2-4 GM/100ML-% IV SOLN
2.0000 g | INTRAVENOUS | Status: AC
  Administered 2022-11-07: 2 g via INTRAVENOUS

## 2022-11-07 MED ORDER — BUPIVACAINE-EPINEPHRINE (PF) 0.5% -1:200000 IJ SOLN
INTRAMUSCULAR | Status: DC | PRN
  Administered 2022-11-07: 40 mL via SURGICAL_CAVITY

## 2022-11-07 MED ORDER — LIDOCAINE HCL (PF) 2 % IJ SOLN
INTRAMUSCULAR | Status: AC
  Filled 2022-11-07: qty 15

## 2022-11-07 MED ORDER — ROCURONIUM BROMIDE 100 MG/10ML IV SOLN
INTRAVENOUS | Status: DC | PRN
  Administered 2022-11-07: 20 mg via INTRAVENOUS
  Administered 2022-11-07: 50 mg via INTRAVENOUS

## 2022-11-07 MED ORDER — ACETAMINOPHEN 500 MG PO TABS: 1000.0000 mg | ORAL_TABLET | ORAL | Status: AC

## 2022-11-07 MED ORDER — CHLORHEXIDINE GLUCONATE 0.12 % MT SOLN: 15.0000 mL | Freq: Once | OROMUCOSAL | Status: AC

## 2022-11-07 MED ORDER — ORAL CARE MOUTH RINSE: 15.0000 mL | Freq: Once | OROMUCOSAL | Status: AC

## 2022-11-07 MED ORDER — ONDANSETRON HCL 4 MG/2ML IJ SOLN
INTRAMUSCULAR | Status: AC
  Filled 2022-11-07: qty 4

## 2022-11-07 MED ORDER — FENTANYL CITRATE (PF) 100 MCG/2ML IJ SOLN
INTRAMUSCULAR | Status: AC
  Filled 2022-11-07: qty 2

## 2022-11-07 MED ORDER — LACTATED RINGERS IV SOLN: INTRAVENOUS | Status: DC | PRN

## 2022-11-07 MED ORDER — HYDROMORPHONE HCL 1 MG/ML IJ SOLN
INTRAMUSCULAR | Status: DC | PRN
  Administered 2022-11-07: .5 mg via INTRAVENOUS

## 2022-11-07 MED ORDER — SEVOFLURANE IN SOLN
RESPIRATORY_TRACT | Status: AC
  Filled 2022-11-07: qty 250

## 2022-11-07 SURGICAL SUPPLY — 35 items
BULB RESERV EVAC DRAIN JP 100C (MISCELLANEOUS) IMPLANT
CHLORAPREP W/TINT 26 (MISCELLANEOUS) ×1 IMPLANT
DERMABOND ADVANCED .7 DNX12 (GAUZE/BANDAGES/DRESSINGS) ×1 IMPLANT
DRAIN CHANNEL JP 15F RND 16 (MISCELLANEOUS) IMPLANT
DRAPE LAPAROTOMY 100X77 ABD (DRAPES) ×1 IMPLANT
DRSG TEGADERM 4X4.75 (GAUZE/BANDAGES/DRESSINGS) IMPLANT
ELECT CAUTERY BLADE TIP 2.5 (TIP) ×1
ELECT REM PT RETURN 9FT ADLT (ELECTROSURGICAL) ×1
ELECTRODE CAUTERY BLDE TIP 2.5 (TIP) ×1 IMPLANT
ELECTRODE REM PT RTRN 9FT ADLT (ELECTROSURGICAL) ×1 IMPLANT
GAUZE 4X4 16PLY ~~LOC~~+RFID DBL (SPONGE) ×1 IMPLANT
GAUZE SPONGE 4X4 12PLY STRL (GAUZE/BANDAGES/DRESSINGS) IMPLANT
GLOVE SURG SYN 7.0 (GLOVE) ×4 IMPLANT
GLOVE SURG SYN 7.0 PF PI (GLOVE) ×1 IMPLANT
GLOVE SURG SYN 7.5  E (GLOVE) ×1
GLOVE SURG SYN 7.5 E (GLOVE) ×1 IMPLANT
GLOVE SURG SYN 7.5 PF PI (GLOVE) ×1 IMPLANT
GOWN STRL REUS W/ TWL LRG LVL3 (GOWN DISPOSABLE) ×2 IMPLANT
GOWN STRL REUS W/TWL LRG LVL3 (GOWN DISPOSABLE) ×2
KIT TURNOVER KIT A (KITS) ×1 IMPLANT
LABEL OR SOLS (LABEL) ×1 IMPLANT
MANIFOLD NEPTUNE II (INSTRUMENTS) ×1 IMPLANT
NEEDLE HYPO 22GX1.5 SAFETY (NEEDLE) ×1 IMPLANT
NS IRRIG 1000ML POUR BTL (IV SOLUTION) ×1 IMPLANT
PACK BASIN MINOR ARMC (MISCELLANEOUS) ×1 IMPLANT
SUT ETHILON 3-0 FS-10 30 BLK (SUTURE) ×1
SUT MNCRL 4-0 (SUTURE) ×1
SUT MNCRL 4-0 27XMFL (SUTURE) ×1
SUT VIC AB 0 SH 27 (SUTURE) ×2 IMPLANT
SUT VIC AB 3-0 SH 27 (SUTURE) ×1
SUT VIC AB 3-0 SH 27X BRD (SUTURE) ×2 IMPLANT
SUTURE EHLN 3-0 FS-10 30 BLK (SUTURE) IMPLANT
SUTURE MNCRL 4-0 27XMF (SUTURE) ×1 IMPLANT
SYR 30ML LL (SYRINGE) ×1 IMPLANT
TRAP FLUID SMOKE EVACUATOR (MISCELLANEOUS) ×1 IMPLANT

## 2022-11-07 NOTE — Anesthesia Preprocedure Evaluation (Signed)
Anesthesia Evaluation  Patient identified by MRN, date of birth, ID band Patient awake    Reviewed: Allergy & Precautions, NPO status , Patient's Chart, lab work & pertinent test results  History of Anesthesia Complications Negative for: history of anesthetic complications  Airway Mallampati: III  TM Distance: >3 FB Neck ROM: full    Dental  (+) Chipped, Dental Advidsory Given   Pulmonary neg shortness of breath, asthma , former smoker   Pulmonary exam normal        Cardiovascular hypertension, (-) angina (-) Past MI and (-) CABG negative cardio ROS Normal cardiovascular exam     Neuro/Psych negative neurological ROS  negative psych ROS   GI/Hepatic Neg liver ROS,GERD  ,,  Endo/Other  negative endocrine ROS    Renal/GU      Musculoskeletal   Abdominal   Peds  Hematology negative hematology ROS (+)   Anesthesia Other Findings Past Medical History: No date: Asthma No date: GERD (gastroesophageal reflux disease) No date: Hypertension  Past Surgical History: No date: CHOLECYSTECTOMY No date: GASTRIC BYPASS No date: HIP SURGERY No date: PARTIAL HYSTERECTOMY No date: TONSILECTOMY, ADENOIDECTOMY, BILATERAL MYRINGOTOMY AND TUBES No date: TUBAL LIGATION  BMI    Body Mass Index: 37.59 kg/m      Reproductive/Obstetrics negative OB ROS                             Anesthesia Physical Anesthesia Plan  ASA: 2  Anesthesia Plan: General ETT   Post-op Pain Management: Ofirmev IV (intra-op)* and Toradol IV (intra-op)*   Induction: Intravenous  PONV Risk Score and Plan: 3 and Ondansetron, Dexamethasone and Midazolam  Airway Management Planned: Oral ETT  Additional Equipment:   Intra-op Plan:   Post-operative Plan: Extubation in OR  Informed Consent: I have reviewed the patients History and Physical, chart, labs and discussed the procedure including the risks, benefits and  alternatives for the proposed anesthesia with the patient or authorized representative who has indicated his/her understanding and acceptance.     Dental Advisory Given  Plan Discussed with: Anesthesiologist, CRNA and Surgeon  Anesthesia Plan Comments: (Patient consented for risks of anesthesia including but not limited to:  - adverse reactions to medications - damage to eyes, teeth, lips or other oral mucosa - nerve damage due to positioning  - sore throat or hoarseness - Damage to heart, brain, nerves, lungs, other parts of body or loss of life  Patient voiced understanding.)       Anesthesia Quick Evaluation

## 2022-11-07 NOTE — Op Note (Signed)
  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  Procedure:  Excision of posterior neck lipoma  Surgeon:  Melvyn Neth, MD  Anesthesia:  General endotracheal  Estimated Blood Loss:  10 ml  Specimens:  Posterior neck lipoma  Complications:  None  Indications for Procedure:  This is a 52 y.o. female with diagnosis of a symptomatic posterior neck lipoma.  The patient wishes to have this excised. The risks of bleeding, abscess or infection, injury to surrounding structures, and need for further procedures were all discussed with the patient and she was willing to proceed.  Description of Procedure: The patient was correctly identified in the preoperative area and brought into the operating room.  The patient was placed supine with VTE prophylaxis in place.  Appropriate time-outs were performed.  Anesthesia was induced and the patient was intubated.  Appropriate antibiotics were infused.  The patient was placed in prone position.  The patient's posterior neck was prepped and draped in usual sterile fashion.  A 6 cm incision was made over the lipoma, and cautery was used to dissect down the subcutaneous tissue to the lipoma itself.  Skin flaps were created using cautery as well, and then the lipoma was excised using cautery, intact.  It measured 10 x 5 cm in size.  It was sent off to pathology.  The cavity was then irrigated and hemostasis was assured with cautery.  Local anesthetic was infused intradermally.  A 15 Fr. Blake drain was placed in the wound cavity.  The skin flaps were then approximated to the wound bed using multiple 0 Vicryl sutures in order to decrease the amount of dead space in the wound.  The wound edges were then closed in two layers using 3-0 Vicryl and 4-0 Monocryl.  The incision was cleaned and sealed with DermaBond.  The drain was secured using 3-0 Nylon.  It was dressed with 4x4  gauze and TegaDerm.  The patient was then emerged from anesthesia, extubated, and brought to the recovery room for further management.    The patient tolerated the procedure well and all counts were correct at the end of the case.   Melvyn Neth, MD

## 2022-11-07 NOTE — Transfer of Care (Signed)
Immediate Anesthesia Transfer of Care Note  Patient: Dawn Douglas  Procedure(s) Performed: EXCISION LIPOMA, lower posterior neck (Neck)  Patient Location: PACU  Anesthesia Type:General  Level of Consciousness: awake, alert , and oriented  Airway & Oxygen Therapy: Patient Spontanous Breathing  Post-op Assessment: Report given to RN and Post -op Vital signs reviewed and stable  Post vital signs: stable  Last Vitals:  Vitals Value Taken Time  BP 112/71 11/07/22 0919  Temp    Pulse 77 11/07/22 0922  Resp 20 11/07/22 0922  SpO2 95 % 11/07/22 0922  Vitals shown include unvalidated device data.  Last Pain:  Vitals:   11/07/22 0625  TempSrc: Temporal  PainSc: 0-No pain         Complications: No notable events documented.

## 2022-11-07 NOTE — H&P (Signed)
Reason for Visit:  Posterior neck lipoma   Requesting Provider:  Daiva Eves, MD   History of Present Illness: Dawn Douglas is a 52 y.o. 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  The patient reports having posterior neck and left shoulder pain for the past year, thinking that it's a pulled muscle or pinched nerve.  She reports intermittent tingliness or numbness in her fingers.  She has used ice and heat without any improvement.  More recently her symptoms have worsened.  She presented to Urgent Care and was given steroid and an injection and muscle relaxer.  She saw her PCP on 09/18/22 and on exam there was concern for a possible lipoma in the posterior neck.  She was referred for further management.  Denies any headaches or symptoms on the right arm.   Past Medical History:     Past Medical History:  Diagnosis Date   Hypertension        Past Surgical History:      Past Surgical History:  Procedure Laterality Date   CHOLECYSTECTOMY       GASTRIC BYPASS       HIP SURGERY       PARTIAL HYSTERECTOMY       TONSILECTOMY, ADENOIDECTOMY, BILATERAL MYRINGOTOMY AND TUBES       TUBAL LIGATION          Home Medications:        Prior to Admission medications   Medication Sig Start Date End Date Taking? Authorizing Provider  albuterol (VENTOLIN HFA) 108 (90 Base) MCG/ACT inhaler Inhale 2 puffs into the lungs every 6 (six) hours as needed for wheezing or shortness of breath. 03/30/22   Yes Tyler Pita, MD  amLODipine-benazepril (LOTREL) 5-40 MG capsule Take 1 capsule by mouth daily. 05/20/21   Yes [provider]  Biotin 10000 MCG TABS Take by mouth.     Yes [provider]  buPROPion (WELLBUTRIN) 100 MG tablet Take 100 mg by mouth 2 (two) times daily.     Yes [provider]  cyanocobalamin (,VITAMIN B-12,) 1000 MCG/ML injection Inject 1,000 mcg into the muscle every 14 (fourteen) days. 05/05/21   Yes [provider]   fluticasone (FLONASE) 50 MCG/ACT nasal spray Place 2 sprays into both nostrils daily. 04/26/21   Yes [provider]  fluticasone furoate-vilanterol (BREO ELLIPTA) 100-25 MCG/ACT AEPB Inhale 1 puff into the lungs daily.     Yes [provider]  furosemide (LASIX) 40 MG tablet Take 40 mg by mouth daily. 05/01/21   Yes [provider]  gabapentin (NEURONTIN) 300 MG capsule Take 300 mg by mouth 3 (three) times daily.     Yes [provider]  rosuvastatin (CRESTOR) 20 MG tablet Take 20 mg by mouth at bedtime. 03/27/21   Yes [provider]  spironolactone (ALDACTONE) 50 MG tablet Take 50 mg by mouth daily. 05/01/21   Yes [provider]  venlafaxine XR (EFFEXOR-XR) 150 MG 24 hr capsule Take 150 mg by mouth daily. 05/21/21   Yes [provider]  zolpidem (AMBIEN CR) 6.25 MG CR tablet Take 6.25 mg by mouth at bedtime. 03/10/21   Yes [provider]      Allergies:      Allergies  Allergen Reactions   Loratadine        Other reaction(s): Headache Other reaction(s): HEADACHE     Amlodipine Swelling      Other reaction(s): SWELLING/EDEMA Other reaction(s): EDEMA  Procaine Swelling      Other reaction(s): UNKNOWN        Social History:  reports that she quit smoking about 34 years ago. Her smoking use included cigarettes. She has a 0.50 pack-year smoking history. She has never used smokeless tobacco. She reports that she does not use drugs. No history on file for alcohol use.    Family History:      Family History  Problem Relation Age of Onset   Hypertension Mother     Heart disease Mother     Diabetes Father     Hypertension Father     Breast cancer Neg Hx        Review of Systems: Review of Systems  Constitutional:  Negative for chills and fever.  HENT:  Negative for hearing loss.   Respiratory:  Negative for shortness of breath.   Cardiovascular:  Negative for chest pain.  Gastrointestinal:  Negative for  nausea and vomiting.  Genitourinary:  Negative for dysuria.  Musculoskeletal:  Negative for myalgias.  Skin:  Negative for rash.  Neurological:  Positive for tingling and sensory change.  Psychiatric/Behavioral:  Negative for depression.       Physical Exam BP 121/85   Pulse 88   Temp 98.7 F (37.1 C) (Oral)   Ht '5\' 5"'$  (1.651 m)   Wt 219 lb (99.3 kg)   SpO2 97%   BMI 36.44 kg/m  CONSTITUTIONAL: No acute distress, well nourished. HEENT:  Normocephalic, atraumatic, extraocular motion intact. NECK: Trachea is midline, and there is no jugular venous distension.  RESPIRATORY:  Lungs are clear, and breath sounds are equal bilaterally. Normal respiratory effort without pathologic use of accessory muscles. CARDIOVASCULAR: Heart is regular without murmurs, gallops, or rubs. MUSCULOSKELETAL:  Normal muscle strength and tone in all four extremities.  No peripheral edema or cyanosis. SKIN: The patient has a 7.5 x 5 cm mass overlying the lower posterior neck, paramedial location off more to the left side.  It is soft, somewhat mobile though limited by the limited space in the posterior neck.  No overlying skin changes or drainage or tenderness. NEUROLOGIC:  Motor and sensation is grossly normal.  Cranial nerves are grossly intact. PSYCH:  Alert and oriented to person, place and time. Affect is normal.   Laboratory Analysis: Lab Results Last 24 Hours  No results found for this or any previous visit (from the past 24 hour(s)).     Imaging: No results found.   Assessment and Plan: This is a 52 y.o. female with a posterior neck lipoma   --Discussed with the patient that the mass she has appears to be a lipoma.  Possibly the symptoms she's having could be related to mass effect, but discussed with her that it could still be a pinched nerve if there is any foraminal stenosis.  I think given the symptoms though, it is very reasonable to excise the mass.  She's in agreement. --Discussed with her  then the plan for Excision of posterior neck lipoma.  Reviewed the surgery at length with her including the incision, risks of bleeding, infection, injury to surrounding structures, the possibility of a drain, that this would be outpatient surgery, post-operative activity restrictions, and she's willing to proceed.    Melvyn Neth, [facility]

## 2022-11-07 NOTE — Anesthesia Postprocedure Evaluation (Signed)
Anesthesia Post Note  Patient: Dawn Douglas  Procedure(s) Performed: EXCISION LIPOMA, lower posterior neck (Neck)  Patient location during evaluation: PACU Anesthesia Type: General Level of consciousness: awake and alert Pain management: pain level controlled Vital Signs Assessment: post-procedure vital signs reviewed and stable Respiratory status: spontaneous breathing, nonlabored ventilation, respiratory function stable and patient connected to nasal cannula oxygen Cardiovascular status: blood pressure returned to baseline and stable Postop Assessment: no apparent nausea or vomiting Anesthetic complications: no  There were no known notable events for this encounter.   Last Vitals:  Vitals:   11/07/22 1014 11/07/22 1026  BP: 107/67 (!) 138/97  Pulse:  72  Resp: 20 18  Temp: (!) 36.1 C (!) 36.2 C  SpO2: 94% 100%    Last Pain:  Vitals:   11/07/22 1026  TempSrc: Temporal  PainSc: [place]

## 2022-11-07 NOTE — Anesthesia Procedure Notes (Signed)
Procedure Name: Intubation Date/Time: 11/07/2022 7:59 AM  Performed by: Patience Musca., CRNAPre-anesthesia Checklist: Patient identified, Patient being monitored, Timeout performed, Emergency Drugs available and Suction available Patient Re-evaluated:Patient Re-evaluated prior to induction Oxygen Delivery Method: Circle system utilized Preoxygenation: Pre-oxygenation with 100% oxygen Induction Type: IV induction Ventilation: Mask ventilation without difficulty Laryngoscope Size: 3 and McGraph Grade View: Grade I Tube type: Oral Tube size: 7.0 mm Number of attempts: 1 Airway Equipment and Method: Stylet Placement Confirmation: ETT inserted through vocal cords under direct vision, positive ETCO2 and breath sounds checked- equal and bilateral Secured at: 21 cm Tube secured with: Tape Dental Injury: Teeth and Oropharynx as per pre-operative assessment

## 2022-11-07 NOTE — Discharge Instructions (Addendum)
Discharge Instructions: 1.  Patient may shower, but do not scrub wounds heavily and dab dry only. 2.  Do not submerge wounds in pool/tub until fully healed. 3.  Do not apply ointments or hydrogen peroxide to the wounds. 4.  May apply ice packs to the wounds for comfort. 5.  Please change dressing around drain once daily -- use 4x4 gauze to wrap around drain and secure with tape. 6.  Please empty and record drain output twice daily and as needed to keep the bulb half full at most.  Bring record with you to office appointment. 7.  Avoid strenuous activity with the left shoulder/arm, and avoid twisting/flexing your neck for 2 weeks.   AMBULATORY SURGERY  DISCHARGE INSTRUCTIONS   The drugs that you were given will stay in your system until tomorrow so for the next 24 hours you should not:  Drive an automobile Make any legal decisions Drink any alcoholic beverage   You may resume regular meals tomorrow.  Today it is better to start with liquids and gradually work up to solid foods.  You may eat anything you prefer, but it is better to start with liquids, then soup and crackers, and gradually work up to solid foods.   Please notify your doctor immediately if you have any unusual bleeding, trouble breathing, redness and pain at the surgery site, drainage, fever, or pain not relieved by medication.    Additional Instructions:        Please contact your physician with any problems or Same Day Surgery at [phone], Monday through Friday 6 am to 4 pm, or [place] at [facility] number at [phone].

## 2022-11-08 LAB — SURGICAL PATHOLOGY

## 2022-11-14 ENCOUNTER — Other Ambulatory Visit: Payer: Self-pay

## 2022-11-14 ENCOUNTER — Ambulatory Visit (INDEPENDENT_AMBULATORY_CARE_PROVIDER_SITE_OTHER): Payer: BC Managed Care – PPO | Admitting: Physician Assistant

## 2022-11-14 ENCOUNTER — Encounter: Payer: Self-pay | Admitting: Physician Assistant

## 2022-11-14 VITALS — BP 135/89 | HR 78 | Temp 97.7°F | Ht 65.0 in | Wt 223.0 lb

## 2022-11-14 DIAGNOSIS — Z09 Encounter for follow-up examination after completed treatment for conditions other than malignant neoplasm: Secondary | ICD-10-CM

## 2022-11-14 DIAGNOSIS — D170 Benign lipomatous neoplasm of skin and subcutaneous tissue of head, face and neck: Secondary | ICD-10-CM

## 2022-11-14 NOTE — Progress Notes (Signed)
[facility] POST-OP OFFICE VISIT  11/14/2022  HPI: Dawn Douglas is a 52 y.o. female 7 days s/p excision of posterior neck lipoma with Dr Hampton Abbot   She is doing well; pain is markedly improved Minimal soreness No fever, chills Incision healing well; no swelling Drain in place; out 10 ccs daily; serous   Vital signs: BP 135/89   Pulse 78   Temp 97.7 F (36.5 C) (Oral)   Ht '5\' 5"'$  (1.651 m)   Wt 223 lb (101.2 kg)   SpO2 98%   BMI 37.11 kg/m    Physical Exam: Constitutional: Well appearing female, NAD Skin: 6 cm incision to the left posterior and inferior neck, this is CDI with dermabond, ecchymotic, no erythema, no drainage, no swelling. Drain in place lateral to the incision; output minimal and grossly serous   Assessment/Plan: This is a 52 y.o. female 7 days s/p excision of posterior neck lipoma with Dr Hampton Abbot    - Drain removed without issues; dressing placed  - Pain control prn  - Reviewed wound care recommendation  - Reviewed lifting restrictions; 2-4 weeks total  - Reviewed surgical pathology; Lipoma  - She can follow up on as needed basis; She understands to call with questions/concerns  -- Edison Simon, PA-C [facility] 11/14/2022, 3:29 PM M-F: 7am - 4pm

## 2022-11-14 NOTE — Patient Instructions (Addendum)
We removed the drain today. Please keep the clear dressing over the area for 2 days. You may shower as usual.     GENERAL POST-OPERATIVE PATIENT INSTRUCTIONS   WOUND CARE INSTRUCTIONS:  Keep a dry clean dressing on the wound if there is drainage. The initial bandage may be removed after 24 hours.  Once the wound has quit draining you may leave it open to air.  If clothing rubs against the wound or causes irritation and the wound is not draining you may cover it with a dry dressing during the daytime.  Try to keep the wound dry and avoid ointments on the wound unless directed to do so.  If the wound becomes bright red and painful or starts to drain infected material that is not clear, please contact your physician immediately.  If the wound is mildly pink and has a thick firm ridge underneath it, this is normal, and is referred to as a healing ridge.  This will resolve over the next 4-6 weeks.  BATHING: You may shower if you have been informed of this by your surgeon. However, Please do not submerge in a tub, hot tub, or pool until incisions are completely sealed or have been told by your surgeon that you may do so.  DIET:  You may eat any foods that you can tolerate.  It is a good idea to eat a high fiber diet and take in plenty of fluids to prevent constipation.  If you do become constipated you may want to take a mild laxative or take ducolax tablets on a daily basis until your bowel habits are regular.  Constipation can be very uncomfortable, along with straining, after recent surgery.  ACTIVITY:  You are encouraged to cough and deep breath or use your incentive spirometer if you were given one, every 15-30 minutes when awake.  This will help prevent respiratory complications and low grade fevers post-operatively if you had a general anesthetic.  You may want to hug a pillow when coughing and sneezing to add additional support to the surgical area, if you had abdominal or chest surgery, which will  decrease pain during these times.  You are encouraged to walk and engage in light activity for the next two weeks.  You should not lift more than 20 pounds for 6 weeks total after surgery as it could put you at increased risk for complications.  Twenty pounds is roughly equivalent to a plastic bag of groceries. At that time- Listen to your body when lifting, if you have pain when lifting, stop and then try again in a few days. Soreness after doing exercises or activities of daily living is normal as you get back in to your normal routine.  MEDICATIONS:  Try to take narcotic medications and anti-inflammatory medications, such as tylenol, ibuprofen, naprosyn, etc., with food.  This will minimize stomach upset from the medication.  Should you develop nausea and vomiting from the pain medication, or develop a rash, please discontinue the medication and contact your physician.  You should not drive, make important decisions, or operate machinery when taking narcotic pain medication.  SUNBLOCK Use sun block to incision area over the next year if this area will be exposed to sun. This helps decrease scarring and will allow you avoid a permanent darkened area over your incision.  QUESTIONS:  Please feel free to call our office if you have any questions, and we will be glad to assist you. [phone]

## 2023-04-18 ENCOUNTER — Other Ambulatory Visit: Payer: Self-pay | Admitting: Nurse Practitioner

## 2023-04-18 DIAGNOSIS — Z1231 Encounter for screening mammogram for malignant neoplasm of breast: Secondary | ICD-10-CM

## 2023-05-15 ENCOUNTER — Ambulatory Visit
Admission: RE | Admit: 2023-05-15 | Discharge: 2023-05-15 | Disposition: A | Discharge status: Home / Self Care | Payer: BC Managed Care – PPO | Source: Ambulatory Visit | Attending: Nurse Practitioner | Admitting: Nurse Practitioner

## 2023-05-15 DIAGNOSIS — Z1231 Encounter for screening mammogram for malignant neoplasm of breast: Secondary | ICD-10-CM | POA: Diagnosis present

## 2023-09-21 ENCOUNTER — Encounter (INDEPENDENT_AMBULATORY_CARE_PROVIDER_SITE_OTHER): Payer: Self-pay | Admitting: Nurse Practitioner

## 2023-09-21 ENCOUNTER — Ambulatory Visit (INDEPENDENT_AMBULATORY_CARE_PROVIDER_SITE_OTHER): Payer: BC Managed Care – PPO | Admitting: Nurse Practitioner

## 2023-09-21 VITALS — BP 144/91 | HR 84 | Resp 16 | Ht 65.5 in | Wt 241.8 lb

## 2023-09-21 DIAGNOSIS — R600 Localized edema: Secondary | ICD-10-CM

## 2023-09-21 DIAGNOSIS — I10 Essential (primary) hypertension: Secondary | ICD-10-CM

## 2023-09-21 NOTE — Progress Notes (Signed)
Subjective:    Patient ID: Dawn Douglas, female    DOB: 06-23-1971, 52 y.o.   MRN: [id number] Chief Complaint  Patient presents with   New Patient (Initial Visit)    Ref Ann Maki consult ble edema    Patient is a 52 year old female who presents today for leg swelling.  She notes that her legs ankle and feet swelling on the left tends to be worse than the right.  She notes that the swelling progresses throughout the day and is better after she slept and gotten up in the morning.  She is always had issues with swelling for years but is not as bad as it has been lately.  The patient is currently retired but she formally worked as a Runner, broadcasting/film/video.  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  Her legs are aching and the worsening has been ongoing over the last 6 to 8 months or so.  Currently there are no open wounds or ulcerations.  The patient has worn medical grade compression socks however she notes that they are not painful for her and they feel as if they make her legs ache worse.    Review of Systems  Cardiovascular:  Positive for leg swelling.  All other systems reviewed and are negative.      Objective:   Physical Exam Vitals reviewed.  HENT:     Head: Normocephalic.  Cardiovascular:     Rate and Rhythm: Normal rate.     Pulses: Normal pulses.  Pulmonary:     Effort: Pulmonary effort is normal.  Musculoskeletal:     Right lower leg: Edema present.     Left lower leg: Edema present.  Skin:    General: Skin is warm and dry.  Neurological:     Mental Status: She is alert and oriented to person, place, and time.  Psychiatric:        Mood and Affect: Mood normal.        Behavior: Behavior normal.        Thought Content: Thought content normal.        Judgment: Judgment normal.     BP (!) 144/91 (BP Location: Right Arm)   Pulse 84   Resp 16   Ht 5' 5.5" (1.664 m)   Wt 241 lb 12.8 oz (109.7  kg)   BMI 39.63 kg/m   Past Medical History:  Diagnosis Date   Asthma    GERD (gastroesophageal reflux disease)    Hypertension     Social History   Socioeconomic History   Marital status: Married    Spouse name: Not on file   Number of children: Not on file   Years of education: Not on file   Highest education level: Not on file  Occupational History   Not on file  Tobacco Use   Smoking status: Former    Current packs/day: 0.00    Average packs/day: 0.3 packs/day for 2.0 years (0.5 ttl pk-yrs)    Types: Cigarettes    Start date: 56    Quit date: 1989    Years since quitting: 35.9   Smokeless tobacco: Never   Tobacco comments:    Patient was not an everyday smoker, socially  Vaping Use   Vaping status: Never Used  Substance and Sexual Activity   Alcohol use: Not on file    Comment: drink daily   Drug use: Never  Sexual activity: Yes  Other Topics Concern   Not on file  Social History Narrative   Not on file   Social Determinants of Health   Financial Resource Strain: Not on file  Food Insecurity: Not on file  Transportation Needs: Not on file  Physical Activity: Not on file  Stress: Not on file  Social Connections: Not on file  Intimate Partner Violence: Not on file    Past Surgical History:  Procedure Laterality Date   CHOLECYSTECTOMY     GASTRIC BYPASS     HIP SURGERY     LIPOMA EXCISION N/A 11/07/2022   Procedure: EXCISION LIPOMA, lower posterior neck;  Surgeon: Henrene Dodge, MD;  Location: [facility] ORS;  Service: General;  Laterality: N/A;   PARTIAL HYSTERECTOMY     TONSILECTOMY, ADENOIDECTOMY, BILATERAL MYRINGOTOMY AND TUBES     TUBAL LIGATION      Family History  Problem Relation Age of Onset   Hypertension Mother    Heart disease Mother    Diabetes Father    Hypertension Father    Breast cancer Neg Hx     Allergies  Allergen Reactions   Loratadine     Other reaction(s): Headache Other reaction(s): HEADACHE    Amlodipine Swelling     Other reaction(s): SWELLING/EDEMA Other reaction(s): EDEMA    Procaine Swelling    Other reaction(s): UNKNOWN        Latest Ref Rng & Units 11/01/2022    8:22 AM  CBC  WBC 4.0 - 10.5 K/uL 6.4   Hemoglobin 12.0 - 15.0 g/dL 19.1   Hematocrit 47.8 - 46.0 % 41.2   Platelets 150 - 400 K/uL 226       CMP     Component Value Date/Time   NA 137 11/01/2022 0822   K 3.8 11/01/2022 0822   CL 102 11/01/2022 0822   CO2 25 11/01/2022 0822   GLUCOSE 91 11/01/2022 0822   BUN 13 11/01/2022 0822   CREATININE 0.80 11/01/2022 0822   CALCIUM 9.2 11/01/2022 0822   GFRNONAA >60 11/01/2022 0822     No results found.     Assessment & Plan:   1. Lower extremity edema Recommend:  I have had a long discussion with the patient regarding swelling and why it  causes symptoms.  Patient will begin wearing graduated compression on a daily basis a prescription was given. The patient will  wear the stockings first thing in the morning and removing them in the evening. The patient is instructed specifically not to sleep in the stockings.   In addition, behavioral modification will be initiated.  This will include frequent elevation, use of over the counter pain medications and exercise such as walking.  Consideration for a lymph pump will also be made based upon the effectiveness of conservative therapy.  This would help to improve the edema control and prevent sequela such as ulcers and infections   Patient should undergo duplex ultrasound of the venous system to ensure that DVT or reflux is not present.  The patient will follow-up with me after the ultrasound.   2. Primary hypertension Continue antihypertensive medications as already ordered, these medications have been reviewed and there are no changes at this time.   Current Outpatient Medications on File Prior to Visit  Medication Sig Dispense Refill   acetaminophen (TYLENOL) 500 MG tablet Take 2 tablets (1,000 mg total) by mouth every 6  (six) hours as needed for mild pain.     albuterol (VENTOLIN HFA) 108 (90  Base) MCG/ACT inhaler Inhale 2 puffs into the lungs every 6 (six) hours as needed for wheezing or shortness of breath. 8 g 2   amLODipine-olmesartan (AZOR) 5-40 MG tablet Take 1 tablet by mouth daily.     Biotin 60454 MCG TABS Take by mouth.     buPROPion (WELLBUTRIN) 100 MG tablet Take 200 mg by mouth daily in the afternoon.     cyanocobalamin (,VITAMIN B-12,) 1000 MCG/ML injection Inject 1,000 mcg into the muscle every 14 (fourteen) days.     estradiol (VIVELLE-DOT) 0.0375 MG/24HR Place 1 patch onto the skin every 3 (three) days.     fenofibrate 54 MG tablet Take 54 mg by mouth at bedtime.     fluticasone (FLONASE) 50 MCG/ACT nasal spray Place 2 sprays into both nostrils daily.     fluticasone furoate-vilanterol (BREO ELLIPTA) 100-25 MCG/ACT AEPB Inhale 1 puff into the lungs daily.     folic acid (FOLVITE) 1 MG tablet Take 1 mg by mouth daily.     furosemide (LASIX) 40 MG tablet Take 40 mg by mouth daily.     gabapentin (NEURONTIN) 300 MG capsule Take 300 mg by mouth at bedtime.     ibuprofen (ADVIL) 600 MG tablet Take 1 tablet (600 mg total) by mouth every 8 (eight) hours as needed for moderate pain. 60 tablet 0   rosuvastatin (CRESTOR) 20 MG tablet Take 20 mg by mouth at bedtime.     spironolactone (ALDACTONE) 50 MG tablet Take 50 mg by mouth daily.     venlafaxine XR (EFFEXOR-XR) 150 MG 24 hr capsule Take 150 mg by mouth daily.     zolpidem (AMBIEN CR) 6.25 MG CR tablet Take 6.25 mg by mouth at bedtime.     zonisamide (ZONEGRAN) 100 MG capsule Take 300 mg by mouth at bedtime.     oxyCODONE (OXY IR/ROXICODONE) 5 MG immediate release tablet Take 1 tablet (5 mg total) by mouth every 6 (six) hours as needed for severe pain. (Patient not taking: Reported on 09/21/2023) 20 tablet 0   No current facility-administered medications on file prior to visit.    There are no Patient Instructions on file for this visit. No  follow-ups on file.   Georgiana Spinner, NP

## 2023-09-22 ENCOUNTER — Encounter (INDEPENDENT_AMBULATORY_CARE_PROVIDER_SITE_OTHER): Payer: Self-pay | Admitting: Nurse Practitioner

## 2023-10-04 ENCOUNTER — Other Ambulatory Visit (INDEPENDENT_AMBULATORY_CARE_PROVIDER_SITE_OTHER): Payer: Self-pay | Admitting: Nurse Practitioner

## 2023-10-04 DIAGNOSIS — R600 Localized edema: Secondary | ICD-10-CM

## 2023-10-08 ENCOUNTER — Ambulatory Visit: Payer: BC Managed Care – PPO | Admitting: Pulmonary Disease

## 2023-10-08 ENCOUNTER — Encounter: Payer: Self-pay | Admitting: Pulmonary Disease

## 2023-10-08 ENCOUNTER — Ambulatory Visit (INDEPENDENT_AMBULATORY_CARE_PROVIDER_SITE_OTHER): Payer: BC Managed Care – PPO

## 2023-10-08 VITALS — BP 158/94 | HR 87 | Temp 97.7°F | Ht 65.5 in | Wt 244.8 lb

## 2023-10-08 DIAGNOSIS — E66812 Obesity, class 2: Secondary | ICD-10-CM

## 2023-10-08 DIAGNOSIS — K219 Gastro-esophageal reflux disease without esophagitis: Secondary | ICD-10-CM

## 2023-10-08 DIAGNOSIS — J45991 Cough variant asthma: Secondary | ICD-10-CM | POA: Diagnosis not present

## 2023-10-08 DIAGNOSIS — R600 Localized edema: Secondary | ICD-10-CM | POA: Diagnosis not present

## 2023-10-08 MED ORDER — ALBUTEROL SULFATE HFA 108 (90 BASE) MCG/ACT IN AERS: 2.0000 | INHALATION_SPRAY | Freq: Four times a day (QID) | RESPIRATORY_TRACT | 2 refills | Status: AC | PRN

## 2023-10-08 MED ORDER — OMEPRAZOLE 40 MG PO CPDR: 40.0000 mg | DELAYED_RELEASE_CAPSULE | Freq: Every day | ORAL | 2 refills | Status: DC

## 2023-10-08 NOTE — Progress Notes (Signed)
Subjective:    Patient ID: Dawn Douglas, female    DOB: 17-Apr-1971, 52 y.o.   MRN: [id number]  Patient Care Team: Ellis Parents, FNP as PCP - General (Nurse Practitioner)  Chief Complaint  Patient presents with   Follow-up    DOE. No wheezing or cough.    BACKGROUND/INTERVAL:Dawn Douglas last seen here 30 March 2022 after initial visit here for evaluation of cough on and August 2022. She had PFTs following that visit which were consistent cough variant asthma. She was started on Breo Ellipta 100/25 with resolution of her persistent cough. She was also on PPI as she was noted to have evidence of gastroesophageal reflux/LPR by ENT.  Since her last visit she discontinue use of Breo and uses only as needed albuterol.  She had discontinued PPI but has noted recurrent GERD symptoms.  Exacerbations since her last visit.  HPI Discussed the use of AI scribe software for clinical note transcription with the patient, who gave verbal consent to proceed.  History of Present Illness   The patient, previously diagnosed with cough variant asthma and reflux, reports significant improvement in her cough over the past year. She still experiences occasional coughing episodes, but these are not as severe as before.  Previously it was noted that her cough was largely triggered by reflux, as documented on ENT notes.  She stopped taking PPI on her own. However, she reports experiencing reflux symptoms over the past week, suggesting a possible resurgence of reflux symptoms. She has previously been treated with omeprazole for this condition.  Out of refills on this.  The patient also mentions that she rarely needs to use her rescue inhaler, perhaps once a month or even less frequently. She notes that her symptoms have not worsened since discontinuing the use of Breo. 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  The patient does not report any shortness of breath or other respiratory issues. She has not received a flu shot and prefers to rely on natural antibodies. She has not reported any new medications or changes in her medication regimen.      DATA 06/09/2021 PFTs: FEV1 2.2 L or 77% predicted, FVC 2.89 L or 79% predicted, FEV1/FVC 77%.  There is significant bronchodilator response with FEV1 normalizing and a total net change of 19% postbronchodilator.  Lung volumes are at the low end of normal with ERV being 9% indicating very mild restriction likely on the basis of obesity.  There is a very significant small airways component.  Diffusion capacity normal.  Consistent with reversible obstructive defect and mild concomitant restriction due to obesity.  Review of Systems A 10 point review of systems was performed and it is as noted above otherwise negative.   Patient Active Problem List   Diagnosis Date Noted   Lipoma of neck 11/07/2022   Cough variant asthma 03/30/2022   Chronic cough 06/08/2021    Social History   Tobacco Use   Smoking status: Former    Current packs/day: 0.00    Average packs/day: 0.3 packs/day for 2.0 years (0.5 ttl pk-yrs)    Types: Cigarettes    Start date: 60    Quit date: 1989    Years since quitting: 35.9   Smokeless tobacco: Never   Tobacco comments:    Patient was not an everyday smoker, socially  Substance Use Topics  Alcohol use: Not on file    Comment: drink daily    Allergies  Allergen Reactions   Loratadine     Other reaction(s): Headache Other reaction(s): HEADACHE    Amlodipine Swelling    Other reaction(s): SWELLING/EDEMA Other reaction(s): EDEMA    Procaine Swelling    Other reaction(s): UNKNOWN     Current Meds  Medication Sig   amLODipine-olmesartan (AZOR) 5-40 MG tablet Take 1 tablet by mouth daily.   Biotin 16109 MCG TABS Take by mouth.   buPROPion (WELLBUTRIN) 100 MG tablet  Take 200 mg by mouth daily in the afternoon.   cyanocobalamin (,VITAMIN B-12,) 1000 MCG/ML injection Inject 1,000 mcg into the muscle every 14 (fourteen) days.   estradiol (VIVELLE-DOT) 0.0375 MG/24HR Place 1 patch onto the skin every 3 (three) days.   fenofibrate 54 MG tablet Take 54 mg by mouth at bedtime.   fluticasone (FLONASE) 50 MCG/ACT nasal spray Place 2 sprays into both nostrils daily.   folic acid (FOLVITE) 1 MG tablet Take 1 mg by mouth daily.   furosemide (LASIX) 40 MG tablet Take 40 mg by mouth daily.   gabapentin (NEURONTIN) 300 MG capsule Take 300 mg by mouth at bedtime.   omeprazole (PRILOSEC) 40 MG capsule Take 1 capsule (40 mg total) by mouth daily.   rosuvastatin (CRESTOR) 20 MG tablet Take 40 mg by mouth at bedtime.   spironolactone (ALDACTONE) 50 MG tablet Take 50 mg by mouth daily.   venlafaxine XR (EFFEXOR-XR) 150 MG 24 hr capsule Take 150 mg by mouth daily.   zolpidem (AMBIEN CR) 6.25 MG CR tablet Take 6.25 mg by mouth at bedtime.   zonisamide (ZONEGRAN) 100 MG capsule Take 300 mg by mouth at bedtime.   [DISCONTINUED] albuterol (VENTOLIN HFA) 108 (90 Base) MCG/ACT inhaler Inhale 2 puffs into the lungs every 6 (six) hours as needed for wheezing or shortness of breath.    Immunization History  Administered Date(s) Administered   [employer] Vaccination 12/28/2019        Objective:     BP (!) 158/94 (BP Location: Right Arm, Cuff Size: Large)   Pulse 87   Temp 97.7 F (36.5 C)   Ht 5' 5.5" (1.664 m)   Wt 244 lb 12.8 oz (111 kg)   SpO2 95%   BMI 40.12 kg/m   SpO2: 95 % O2 Device: None (Room air)  GENERAL: Obese woman, no acute distress, fully ambulatory, no conversational dyspnea. HEAD: Normocephalic, atraumatic.  EYES: Pupils equal, round, reactive to light.  No scleral icterus.  MOUTH: Oral mucosa moist.  No thrush. NECK: Supple. No thyromegaly. Trachea midline. No JVD.  No adenopathy. PULMONARY: Good air entry bilaterally.  No adventitious  sounds. CARDIOVASCULAR: S1 and S2. Regular rate and rhythm.  No rubs, murmurs or gallops heard. ABDOMEN: Obese otherwise benign. MUSCULOSKELETAL: No joint deformity, no clubbing, no edema.  NEUROLOGIC: No overt focal deficit, no gait disturbance, speech is fluent. SKIN: Intact,warm,dry. PSYCH: Mood and behavior normal.         Assessment & Plan:     ICD-10-CM   1. Chronic cough  R05.3     2. Laryngopharyngeal reflux (LPR)  K21.9     3. Obesity, Class II, BMI 35-39.9, isolated (see actual BMI)  U04.540       No orders of the defined types were placed in this encounter.   Meds ordered this encounter  Medications   omeprazole (PRILOSEC) 40 MG capsule    Sig: Take 1 capsule (40 mg  total) by mouth daily.    Dispense:  30 capsule    Refill:  2   albuterol (VENTOLIN HFA) 108 (90 Base) MCG/ACT inhaler    Sig: Inhale 2 puffs into the lungs every 6 (six) hours as needed for wheezing or shortness of breath.    Dispense:  8 g    Refill:  2    Assessment and Plan    Cough Variant Asthma Cough variant asthma, currently well-controlled. Occasional cough, significantly improved since last visit. No recent use of rescue inhaler, usage varies from none to three or four times a month. No shortness of breath. Physical exam reveals clear lung sounds. Retired from a school environment with black mold, previously exacerbating symptoms. - Refill rescue inhaler prescription - Follow-up in one year unless issues arise  Gastroesophageal Reflux Disease (GERD) Recurrence of pain over the past week, likely due to GERD. Previously managed with omeprazole, resumed using from spouse's prescription. No current prescription or refills available. Discussed importance of using own prescription and not sharing medications. - Send prescription for omeprazole with two refills - Advise follow-up with primary care provider for future refills  General Health Maintenance Prefers natural immunity over flu  vaccination. Discussed potential risks of not receiving the flu vaccine, including increased susceptibility to influenza and potential complications. - Discussed preference for natural immunity over flu vaccination  Follow-up - Schedule follow-up visit in one year.     Advised if symptoms do not improve or worsen, to please contact office for sooner follow up or seek emergency care.    I spent 32 minutes of dedicated to the care of this patient on the date of this encounter to include pre-visit review of records, face-to-face time with the patient discussing conditions above, post visit ordering of testing, clinical documentation with the electronic health record, making appropriate referrals as documented, and communicating necessary findings to members of the patients care team.     C. Danice Goltz, MD Advanced [facility]ch[facility]    *This note was generated using voice recognition software/Dragon and/or AI transcription program.  Despite best efforts to proofread, errors can occur which can change the meaning. Any transcriptional errors that result from this process are unintentional and may not be fully corrected at the time of dictation.

## 2023-10-08 NOTE — Patient Instructions (Signed)
VISIT SUMMARY:  During today's visit, we discussed your cough variant asthma and gastroesophageal reflux disease (GERD). You reported significant improvement in your cough and asthma symptoms, with occasional mild episodes. However, you have experienced some pain over the past week, likely due to a recurrence of GERD. We also reviewed your general health maintenance preferences, including your choice to rely on natural immunity instead of receiving a flu shot.  YOUR PLAN:  -COUGH VARIANT ASTHMA: Cough variant asthma is a type of asthma where the main symptom is a dry, non-productive cough. Your asthma is currently well-controlled with only occasional mild episodes. We will refill your rescue inhaler prescription and plan to follow up in one year unless any issues arise.  -GASTROESOPHAGEAL REFLUX DISEASE (GERD): GERD is a condition where stomach acid frequently flows back into the tube connecting your mouth and stomach, causing irritation. You have experienced a recurrence of pain likely due to GERD. We will send a prescription for omeprazole with two refills and advise you to follow up with your primary care provider for future refills.  -GENERAL HEALTH MAINTENANCE: We discussed your preference for natural immunity over receiving the flu vaccine. It is important to be aware of the potential risks of not getting the flu shot, including increased susceptibility to influenza and potential complications.  INSTRUCTIONS:  Please schedule a follow-up visit in one year. If you experience any issues with your asthma or GERD before then, do not hesitate to contact us.

## 2023-10-16 ENCOUNTER — Encounter (INDEPENDENT_AMBULATORY_CARE_PROVIDER_SITE_OTHER): Payer: Self-pay | Admitting: Vascular Surgery

## 2023-10-16 ENCOUNTER — Ambulatory Visit (INDEPENDENT_AMBULATORY_CARE_PROVIDER_SITE_OTHER): Payer: BC Managed Care – PPO | Admitting: Vascular Surgery

## 2023-10-16 VITALS — BP 173/105 | HR 82 | Resp 18 | Ht 65.0 in | Wt 242.6 lb

## 2023-10-16 DIAGNOSIS — I10 Essential (primary) hypertension: Secondary | ICD-10-CM

## 2023-10-16 DIAGNOSIS — I890 Lymphedema, not elsewhere classified: Secondary | ICD-10-CM

## 2023-10-16 DIAGNOSIS — E785 Hyperlipidemia, unspecified: Secondary | ICD-10-CM

## 2023-10-16 DIAGNOSIS — M7989 Other specified soft tissue disorders: Secondary | ICD-10-CM | POA: Diagnosis not present

## 2023-10-16 NOTE — Assessment & Plan Note (Signed)
lipid control important in reducing the progression of atherosclerotic disease. Continue statin therapy  

## 2023-10-16 NOTE — Assessment & Plan Note (Signed)
A reflux study was done last week demonstrating no evidence of DVT or superficial thrombophlebitis in either lower extremity.  No significant venous reflux was identified in either lower extremity. Symptoms are persistent and severe despite 4 weeks of compression, elevation, and exercise.  Hyperpigmentation was present.  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  A lymphedema pump would be an excellent adjuvant therapy to improve her symptoms.  We will try to obtain that for her at this time.  Will plan to see her back in 3 months in follow-up.

## 2023-10-16 NOTE — Assessment & Plan Note (Signed)
blood pressure control important in reducing the progression of atherosclerotic disease. On appropriate oral medications.  

## 2023-10-16 NOTE — Progress Notes (Signed)
MRN : [id number]  Dawn Douglas is a 52 y.o. (May 05, 1971) female who presents with chief complaint of  Chief Complaint  Patient presents with   Follow-up    pt conv bil Venus Reflux  .  History of Present Illness: Patient returns today in follow up of leg swelling and pain.  Compression socks, elevation, and exercise over the past month have really not done much good for her in terms of improving her symptoms.  She has been doing this now for approximately 4 weeks and sees very little benefit.  She has had some skin thickening and changes but no open wounds or ulceration.  She cannot get certain boots or pants on anymore.  A reflux study was done last week demonstrating no evidence of DVT or superficial thrombophlebitis in either lower extremity.  No significant venous reflux was identified in either lower extremity.  Current Outpatient Medications  Medication Sig Dispense Refill   acetaminophen (TYLENOL) 500 MG tablet Take 2 tablets (1,000 mg total) by mouth every 6 (six) hours as needed for mild pain.     albuterol (VENTOLIN HFA) 108 (90 Base) MCG/ACT inhaler Inhale 2 puffs into the lungs every 6 (six) hours as needed for wheezing or shortness of breath. 8 g 2   amLODipine-olmesartan (AZOR) 5-40 MG tablet Take 1 tablet by mouth daily.     Biotin 41324 MCG TABS Take by mouth.     buPROPion (WELLBUTRIN) 100 MG tablet Take 200 mg by mouth daily in the afternoon.     cyanocobalamin (,VITAMIN B-12,) 1000 MCG/ML injection Inject 1,000 mcg into the muscle every 14 (fourteen) days.     estradiol (VIVELLE-DOT) 0.0375 MG/24HR Place 1 patch onto the skin every 3 (three) days.     fenofibrate 54 MG tablet Take 54 mg by mouth at bedtime.     fluticasone (FLONASE) 50 MCG/ACT nasal spray Place 2 sprays into both nostrils daily.     folic acid (FOLVITE) 1 MG tablet Take 1 mg by mouth daily.     furosemide (LASIX) 40 MG tablet Take 40 mg by mouth daily.     gabapentin (NEURONTIN) 300 MG capsule  Take 300 mg by mouth at bedtime.     omeprazole (PRILOSEC) 40 MG capsule Take 1 capsule (40 mg total) by mouth daily. 30 capsule 2   rosuvastatin (CRESTOR) 20 MG tablet Take 40 mg by mouth at bedtime.     spironolactone (ALDACTONE) 50 MG tablet Take 50 mg by mouth daily.     venlafaxine XR (EFFEXOR-XR) 150 MG 24 hr capsule Take 150 mg by mouth daily.     zolpidem (AMBIEN CR) 6.25 MG CR tablet Take 6.25 mg by mouth at bedtime.     zonisamide (ZONEGRAN) 100 MG capsule Take 300 mg by mouth at bedtime.     fluticasone furoate-vilanterol (BREO ELLIPTA) 100-25 MCG/ACT AEPB Inhale 1 puff into the lungs daily. (Patient not taking: Reported on 10/16/2023)     ibuprofen (ADVIL) 600 MG tablet Take 1 tablet (600 mg total) by mouth every 8 (eight) hours as needed for moderate pain. (Patient not taking: Reported on 10/16/2023) 60 tablet 0   oxyCODONE (OXY IR/ROXICODONE) 5 MG immediate release tablet Take 1 tablet (5 mg total) by mouth every 6 (six) hours as needed for severe pain. (Patient not taking: Reported on 10/16/2023) 20 tablet 0   No current facility-administered medications for this visit.    Past Medical History:  Diagnosis Date   Asthma    GERD (  gastroesophageal reflux disease)    Hypertension     Past Surgical History:  Procedure Laterality Date   CHOLECYSTECTOMY     GASTRIC BYPASS     HIP SURGERY     LIPOMA EXCISION N/A 11/07/2022   Procedure: EXCISION LIPOMA, lower posterior neck;  Surgeon: Henrene Dodge, MD;  Location: [facility];  Service: General;  Laterality: N/A;   PARTIAL HYSTERECTOMY     TONSILECTOMY, ADENOIDECTOMY, BILATERAL MYRINGOTOMY AND TUBES     TUBAL LIGATION       Social History   Tobacco Use   Smoking status: Former    Current packs/day: 0.00    Average packs/day: 0.3 packs/day for 2.0 years (0.5 ttl pk-yrs)    Types: Cigarettes    Start date: 89    Quit date: 1989    Years since quitting: 35.9   Smokeless tobacco: Never   Tobacco comments:    Patient was  not an everyday smoker, socially  Vaping Use   Vaping status: Never Used  Substance Use Topics   Drug use: Never       Family History  Problem Relation Age of Onset   Hypertension Mother    Heart disease Mother    Diabetes Father    Hypertension Father    Breast cancer Neg Hx      Allergies  Allergen Reactions   Loratadine     Other reaction(s): Headache Other reaction(s): HEADACHE    Amlodipine Swelling    Other reaction(s): SWELLING/EDEMA Other reaction(s): EDEMA    Procaine Swelling    Other reaction(s): UNKNOWN      REVIEW OF SYSTEMS (Negative unless checked)  Constitutional: [] Weight loss  [] Fever  [] Chills Cardiac: [] Chest pain   [] Chest pressure   [] Palpitations   [] Shortness of breath when laying flat   [] Shortness of breath at rest   [] Shortness of breath with exertion. Vascular:  [] Pain in legs with walking   [] Pain in legs at rest   [] Pain in legs when laying flat   [] Claudication   [] Pain in feet when walking  [] Pain in feet at rest  [] Pain in feet when laying flat   [] History of DVT   [] Phlebitis   [x] Swelling in legs   [] Varicose veins   [] Non-healing ulcers Pulmonary:   [] Uses home oxygen   [] Productive cough   [] Hemoptysis   [] Wheeze  [] COPD   [] Asthma Neurologic:  [] Dizziness  [] Blackouts   [] Seizures   [] History of stroke   [] History of TIA  [] Aphasia   [] Temporary blindness   [] Dysphagia   [] Weakness or numbness in arms   [] Weakness or numbness in legs Musculoskeletal:  [] Arthritis   [] Joint swelling   [x] Joint pain   [] Low back pain Hematologic:  [] Easy bruising  [] Easy bleeding   [] Hypercoagulable state   [] Anemic   Gastrointestinal:  [] Blood in stool   [] Vomiting blood  [] Gastroesophageal reflux/heartburn   [] Abdominal pain Genitourinary:  [] Chronic kidney disease   [] Difficult urination  [] Frequent urination  [] Burning with urination   [] Hematuria Skin:  [] Rashes   [] Ulcers   [] Wounds Psychological:  [] History of anxiety   []  History of major  depression.  Physical Examination  BP (!) 173/105   Pulse 82   Resp 18   Ht 5\' 5"  (1.651 m)   Wt 242 lb 9.6 oz (110 kg)   BMI 40.37 kg/m  Gen:  WD/WN, NAD Head: [place]/AT, No temporalis wasting. Ear/Nose/Throat: Hearing grossly intact, nares w/o erythema or drainage Eyes: Conjunctiva clear. Sclera non-icteric Neck: Supple.  Trachea midline Pulmonary:  Good air movement, no use of accessory muscles.  Cardiac: RRR, no JVD Vascular:  Vessel Right Left  Radial Palpable Palpable                   Musculoskeletal: M/S 5/5 throughout.  No deformity or atrophy. 1+ RLE edema, 1-2+ LLE edema.  Mild stasis dermatitis changes with significant hyperpigmentation changes to both lower extremities Neurologic: Sensation grossly intact in extremities.  Symmetrical.  Speech is fluent.  Psychiatric: Judgment intact, Mood & affect appropriate for pt's clinical situation. Dermatologic: No rashes or ulcers noted.  No cellulitis or open wounds.      Labs No results found for this or any previous visit (from the past 2160 hours).  Radiology VAS [place] LOWER EXTREMITY VENOUS REFLUX Result Date: 10/09/2023  Lower Venous Reflux Study Patient Name:  Dawn Douglas  Date of Exam:   10/08/2023 Medical Rec #: [id number]            Accession #:    [id number] Date of Birth: 1971/03/27            Patient Gender: F Patient Age:   38 years Exam Location:  [facility] Procedure:      VAS [place] LOWER EXTREMITY VENOUS REFLUX Referring Phys: Sheppard Plumber --------------------------------------------------------------------------------  Indications: Swelling.  Performing Technologist: Debbe Bales RVS  Examination Guidelines: A complete evaluation includes B-mode imaging, spectral Doppler, color Doppler, and power Doppler as needed of all accessible portions of each vessel. Bilateral testing is considered an integral part of a complete examination. Limited examinations for reoccurring indications may be  performed as noted. The reflux portion of the exam is performed with the patient in reverse Trendelenburg. Significant venous reflux is defined as >500 ms in the superficial venous system, and >1 second in the deep venous system.  Venous Reflux Times +--------------+---------+------+-----------+------------+--------+ RIGHT         Reflux NoRefluxReflux TimeDiameter cmsComments                         Yes                                  +--------------+---------+------+-----------+------------+--------+ CFV           no                                             +--------------+---------+------+-----------+------------+--------+ FV prox       no                                             +--------------+---------+------+-----------+------------+--------+ FV mid        no                                             +--------------+---------+------+-----------+------------+--------+ FV dist       no                                             +--------------+---------+------+-----------+------------+--------+  Popliteal     no                                             +--------------+---------+------+-----------+------------+--------+ GSV at SFJ    no                            .78              +--------------+---------+------+-----------+------------+--------+ GSV prox thighno                            .55              +--------------+---------+------+-----------+------------+--------+ GSV mid thigh no                            .52              +--------------+---------+------+-----------+------------+--------+ GSV dist thighno                            .45              +--------------+---------+------+-----------+------------+--------+ GSV at knee   no                            .44              +--------------+---------+------+-----------+------------+--------+ GSV prox calf no                            .33               +--------------+---------+------+-----------+------------+--------+ SSV Pop Fossa no                            .27              +--------------+---------+------+-----------+------------+--------+  +--------------+---------+------+-----------+------------+--------+ LEFT          Reflux NoRefluxReflux TimeDiameter cmsComments                         Yes                                  +--------------+---------+------+-----------+------------+--------+ CFV           no                                             +--------------+---------+------+-----------+------------+--------+ FV prox       no                                             +--------------+---------+------+-----------+------------+--------+ FV mid        no                                             +--------------+---------+------+-----------+------------+--------+  FV dist       no                                             +--------------+---------+------+-----------+------------+--------+ Popliteal     no                                             +--------------+---------+------+-----------+------------+--------+ GSV at SFJ    no                            .53              +--------------+---------+------+-----------+------------+--------+ GSV prox thighno                            .50              +--------------+---------+------+-----------+------------+--------+ GSV mid thigh no                            .50              +--------------+---------+------+-----------+------------+--------+ GSV dist thighno                            .48              +--------------+---------+------+-----------+------------+--------+ GSV at knee   no                            .41              +--------------+---------+------+-----------+------------+--------+ GSV prox calf no                            .34               +--------------+---------+------+-----------+------------+--------+ SSV Pop Fossa no                            .16              +--------------+---------+------+-----------+------------+--------+   Summary: Bilateral: - No evidence of deep vein thrombosis seen in the lower extremities, bilaterally, from the common femoral through the popliteal veins. - No evidence of superficial venous thrombosis in the lower extremities, bilaterally. - No evidence of deep venous insufficiency seen bilaterally in the lower extremity. - No evidence of superficial venous reflux seen in the greater saphenous veins bilaterally. - No evidence of superficial venous reflux seen in the short saphenous veins bilaterally.  Right: - There appears to be an Old Baker's Cyst seen behind the Right Knne area measuring 3.04 cm x 2.06 cm with no flow present.  *See table(s) above for measurements and observations. Electronically signed by Festus Barren MD on 10/09/2023 at 9:45:08 AM.    Final     Assessment/Plan  Hypertension blood pressure control important in reducing the progression of atherosclerotic disease. On appropriate oral medications.   Hyperlipidemia lipid control important in reducing the progression of atherosclerotic disease. Continue statin therapy   Swelling  of limb  A reflux study was done last week demonstrating no evidence of DVT or superficial thrombophlebitis in either lower extremity.  No significant venous reflux was identified in either lower extremity. Symptoms are persistent and severe despite 4 weeks of compression, elevation, and exercise.  Hyperpigmentation was present.  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  A lymphedema pump would be an excellent adjuvant therapy to improve her symptoms.  We will try to obtain that for her at this time.  Will plan to see her back in 3 months in follow-up.  Lymphedema  A  reflux study was done last week demonstrating no evidence of DVT or superficial thrombophlebitis in either lower extremity.  No significant venous reflux was identified in either lower extremity. Symptoms are persistent and severe despite 4 weeks of compression, elevation, and exercise.  Hyperpigmentation was present.  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  A lymphedema pump would be an excellent adjuvant therapy to improve her symptoms.  We will try to obtain that for her at this time.  Will plan to see her back in 3 months in follow-up.    Festus Barren, MD  10/16/2023 4:46 PM    This note was created with Dragon medical transcription system.  Any errors from dictation are purely unintentional

## 2024-03-18 ENCOUNTER — Encounter (INDEPENDENT_AMBULATORY_CARE_PROVIDER_SITE_OTHER): Payer: Self-pay

## 2024-05-20 ENCOUNTER — Other Ambulatory Visit: Payer: Self-pay | Admitting: Nurse Practitioner

## 2024-05-20 DIAGNOSIS — Z1231 Encounter for screening mammogram for malignant neoplasm of breast: Secondary | ICD-10-CM

## 2024-06-13 ENCOUNTER — Ambulatory Visit
Admission: RE | Admit: 2024-06-13 | Discharge: 2024-06-13 | Disposition: A | Discharge status: Home / Self Care | Payer: Self-pay | Source: Ambulatory Visit | Attending: Nurse Practitioner | Admitting: Nurse Practitioner

## 2024-06-13 DIAGNOSIS — Z1231 Encounter for screening mammogram for malignant neoplasm of breast: Secondary | ICD-10-CM | POA: Diagnosis present

## 2024-08-13 ENCOUNTER — Other Ambulatory Visit: Payer: Self-pay | Admitting: Pulmonary Disease

## 2024-09-09 ENCOUNTER — Other Ambulatory Visit: Payer: Self-pay | Admitting: Internal Medicine

## 2024-09-09 DIAGNOSIS — K760 Fatty (change of) liver, not elsewhere classified: Secondary | ICD-10-CM

## 2024-09-16 ENCOUNTER — Ambulatory Visit
Admission: RE | Admit: 2024-09-16 | Discharge: 2024-09-16 | Disposition: A | Discharge status: Home / Self Care | Source: Ambulatory Visit | Attending: Internal Medicine | Admitting: Internal Medicine

## 2024-09-16 DIAGNOSIS — K760 Fatty (change of) liver, not elsewhere classified: Secondary | ICD-10-CM
